# Patient Record
Sex: Female | Born: 1935 | Race: Black or African American | Hispanic: No | Smoking: Never smoker
Health system: Southern US, Community
[De-identification: ages and names within clinical notes are randomized; demographics above are authoritative.]

## PROBLEM LIST (undated history)

## (undated) DIAGNOSIS — I10 Essential (primary) hypertension: Secondary | ICD-10-CM

## (undated) DIAGNOSIS — I2510 Atherosclerotic heart disease of native coronary artery without angina pectoris: Secondary | ICD-10-CM

## (undated) DIAGNOSIS — M1990 Unspecified osteoarthritis, unspecified site: Secondary | ICD-10-CM

## (undated) DIAGNOSIS — R29898 Other symptoms and signs involving the musculoskeletal system: Secondary | ICD-10-CM

## (undated) DIAGNOSIS — N289 Disorder of kidney and ureter, unspecified: Secondary | ICD-10-CM

## (undated) DIAGNOSIS — R001 Bradycardia, unspecified: Secondary | ICD-10-CM

## (undated) DIAGNOSIS — Z950 Presence of cardiac pacemaker: Secondary | ICD-10-CM

## (undated) HISTORY — PX: PACEMAKER INSERTION: SHX728

## (undated) HISTORY — PX: SHOULDER SURGERY: SHX246

## (undated) HISTORY — PX: ABDOMINAL HYSTERECTOMY: SHX81

## (undated) HISTORY — PX: OTHER SURGICAL HISTORY: SHX169

## (undated) HISTORY — PX: REPLACEMENT TOTAL KNEE BILATERAL: SUR1225

## (undated) HISTORY — PX: JOINT REPLACEMENT: SHX530

---

## 2008-09-08 ENCOUNTER — Emergency Department (HOSPITAL_COMMUNITY): Admission: EM | Admit: 2008-09-08 | Discharge: 2008-09-08 | Payer: Self-pay | Admitting: Emergency Medicine

## 2010-08-10 ENCOUNTER — Emergency Department (HOSPITAL_BASED_OUTPATIENT_CLINIC_OR_DEPARTMENT_OTHER): Admission: EM | Admit: 2010-08-10 | Discharge: 2010-08-10 | Payer: Self-pay | Admitting: Emergency Medicine

## 2010-08-10 ENCOUNTER — Ambulatory Visit: Payer: Self-pay | Admitting: Diagnostic Radiology

## 2010-09-03 ENCOUNTER — Emergency Department (HOSPITAL_BASED_OUTPATIENT_CLINIC_OR_DEPARTMENT_OTHER)
Admission: EM | Admit: 2010-09-03 | Discharge: 2010-09-03 | Disposition: A | Discharge status: Other Acute Inpatient Hospital | Payer: Self-pay | Source: Home / Self Care | Admitting: Emergency Medicine

## 2010-11-30 LAB — POCT CARDIAC MARKERS
Myoglobin, poc: 109 ng/mL (ref 12–200)
Troponin i, poc: <0.05 ng/mL (ref 0.00–0.09)

## 2010-11-30 LAB — COMPREHENSIVE METABOLIC PANEL
Albumin: 3.7 g/dL (ref 3.5–5.2)
BUN: 65 mg/dL — ABNORMAL HIGH (ref 6–23)
Calcium: 9.7 mg/dL (ref 8.4–10.5)
Creatinine, Ser: 2.4 mg/dL — ABNORMAL HIGH (ref 0.4–1.2)
Potassium: 5.4 mEq/L — ABNORMAL HIGH (ref 3.5–5.1)
Total Protein: 6.5 g/dL (ref 6.0–8.3)

## 2010-11-30 LAB — CBC
MCH: 32.3 pg (ref 26.0–34.0)
MCHC: 33.4 g/dL (ref 30.0–36.0)
MCV: 97 fL (ref 78.0–100.0)
Platelets: 152 10*3/uL (ref 150–400)
RDW: 12.8 % (ref 11.5–15.5)
WBC: 4.4 10*3/uL (ref 4.0–10.5)

## 2010-11-30 LAB — APTT: aPTT: 31 seconds (ref 24–37)

## 2010-11-30 LAB — PROTIME-INR: INR: 1.09 (ref 0.00–1.49)

## 2010-11-30 LAB — POCT B-TYPE NATRIURETIC PEPTIDE (BNP): B Natriuretic Peptide, POC: 197 pg/mL — ABNORMAL HIGH (ref 0–100)

## 2010-12-01 LAB — DIFFERENTIAL
Basophils Relative: 0 % (ref 0–1)
Lymphocytes Relative: 13 % (ref 12–46)
Lymphs Abs: 0.7 10*3/uL (ref 0.7–4.0)
Monocytes Absolute: 0.3 10*3/uL (ref 0.1–1.0)
Monocytes Relative: 7 % (ref 3–12)
Neutro Abs: 4.3 10*3/uL (ref 1.7–7.7)
Neutrophils Relative %: 78 % — ABNORMAL HIGH (ref 43–77)

## 2010-12-01 LAB — BASIC METABOLIC PANEL
BUN: 40 mg/dL — ABNORMAL HIGH (ref 6–23)
CO2: 23 mEq/L (ref 19–32)
Calcium: 9.4 mg/dL (ref 8.4–10.5)
Creatinine, Ser: 1.8 mg/dL — ABNORMAL HIGH (ref 0.4–1.2)
GFR calc Af Amer: 33 mL/min — ABNORMAL LOW (ref >60)

## 2010-12-01 LAB — CBC
HCT: 30.5 % — ABNORMAL LOW (ref 36.0–46.0)
Hemoglobin: 10.8 g/dL — ABNORMAL LOW (ref 12.0–15.0)
MCHC: 35.5 g/dL (ref 30.0–36.0)
RBC: 3.18 MIL/uL — ABNORMAL LOW (ref 3.87–5.11)

## 2010-12-01 LAB — URINE MICROSCOPIC-ADD ON

## 2010-12-01 LAB — URINE CULTURE: Culture: NO GROWTH

## 2010-12-01 LAB — URINALYSIS, ROUTINE W REFLEX MICROSCOPIC
Glucose, UA: NEGATIVE mg/dL
Nitrite: NEGATIVE
pH: 5.5 (ref 5.0–8.0)

## 2011-02-26 ENCOUNTER — Emergency Department (INDEPENDENT_AMBULATORY_CARE_PROVIDER_SITE_OTHER): Payer: PRIVATE HEALTH INSURANCE

## 2011-02-26 ENCOUNTER — Emergency Department (HOSPITAL_BASED_OUTPATIENT_CLINIC_OR_DEPARTMENT_OTHER)
Admission: EM | Admit: 2011-02-26 | Discharge: 2011-02-26 | Disposition: A | Discharge status: Home / Self Care | Payer: PRIVATE HEALTH INSURANCE | Attending: Emergency Medicine | Admitting: Emergency Medicine

## 2011-02-26 DIAGNOSIS — Z9884 Bariatric surgery status: Secondary | ICD-10-CM | POA: Insufficient documentation

## 2011-02-26 DIAGNOSIS — M109 Gout, unspecified: Secondary | ICD-10-CM | POA: Insufficient documentation

## 2011-02-26 DIAGNOSIS — M129 Arthropathy, unspecified: Secondary | ICD-10-CM | POA: Insufficient documentation

## 2011-02-26 DIAGNOSIS — Z950 Presence of cardiac pacemaker: Secondary | ICD-10-CM | POA: Insufficient documentation

## 2011-02-26 DIAGNOSIS — R109 Unspecified abdominal pain: Secondary | ICD-10-CM | POA: Insufficient documentation

## 2011-02-26 DIAGNOSIS — I10 Essential (primary) hypertension: Secondary | ICD-10-CM | POA: Insufficient documentation

## 2011-02-26 DIAGNOSIS — Z79899 Other long term (current) drug therapy: Secondary | ICD-10-CM | POA: Insufficient documentation

## 2011-02-26 DIAGNOSIS — N289 Disorder of kidney and ureter, unspecified: Secondary | ICD-10-CM | POA: Insufficient documentation

## 2011-02-26 DIAGNOSIS — Z96659 Presence of unspecified artificial knee joint: Secondary | ICD-10-CM | POA: Insufficient documentation

## 2011-02-26 LAB — DIFFERENTIAL
Basophils Absolute: 0 10*3/uL (ref 0.0–0.1)
Basophils Relative: 1 % (ref 0–1)
Lymphocytes Relative: 9 % — ABNORMAL LOW (ref 12–46)
Monocytes Absolute: 0.3 10*3/uL (ref 0.1–1.0)
Neutro Abs: 5.1 10*3/uL (ref 1.7–7.7)
Neutrophils Relative %: 85 % — ABNORMAL HIGH (ref 43–77)

## 2011-02-26 LAB — CBC
HCT: 32.3 % — ABNORMAL LOW (ref 36.0–46.0)
Hemoglobin: 10.7 g/dL — ABNORMAL LOW (ref 12.0–15.0)
MCHC: 33.1 g/dL (ref 30.0–36.0)
WBC: 6 10*3/uL (ref 4.0–10.5)

## 2011-02-26 LAB — URINALYSIS, ROUTINE W REFLEX MICROSCOPIC
Bilirubin Urine: NEGATIVE
Glucose, UA: NEGATIVE mg/dL
Hgb urine dipstick: NEGATIVE
Specific Gravity, Urine: 1.015 (ref 1.005–1.030)
Urobilinogen, UA: 0.2 mg/dL (ref 0.0–1.0)
pH: 5.5 (ref 5.0–8.0)

## 2011-02-26 LAB — URINE MICROSCOPIC-ADD ON

## 2011-02-26 LAB — COMPREHENSIVE METABOLIC PANEL
ALT: 13 U/L (ref 0–35)
Albumin: 3.6 g/dL (ref 3.5–5.2)
Alkaline Phosphatase: 58 U/L (ref 39–117)
Chloride: 98 mEq/L (ref 96–112)
GFR calc Af Amer: 26 mL/min — ABNORMAL LOW (ref >60)
Glucose, Bld: 102 mg/dL — ABNORMAL HIGH (ref 70–99)
Potassium: 4.2 mEq/L (ref 3.5–5.1)
Sodium: 136 mEq/L (ref 135–145)
Total Bilirubin: 0.4 mg/dL (ref 0.3–1.2)
Total Protein: 6.8 g/dL (ref 6.0–8.3)

## 2011-06-02 ENCOUNTER — Other Ambulatory Visit: Payer: Self-pay

## 2011-06-02 ENCOUNTER — Emergency Department (HOSPITAL_BASED_OUTPATIENT_CLINIC_OR_DEPARTMENT_OTHER)
Admission: EM | Admit: 2011-06-02 | Discharge: 2011-06-02 | Disposition: A | Discharge status: Home / Self Care | Payer: PRIVATE HEALTH INSURANCE | Source: Home / Self Care | Attending: Emergency Medicine | Admitting: Emergency Medicine

## 2011-06-02 ENCOUNTER — Inpatient Hospital Stay (HOSPITAL_COMMUNITY)
Admission: EM | Admit: 2011-06-02 | Discharge: 2011-06-04 | DRG: 694 | Disposition: A | Discharge status: Home / Self Care | Payer: PRIVATE HEALTH INSURANCE | Source: Ambulatory Visit | Attending: Urology | Admitting: Urology

## 2011-06-02 ENCOUNTER — Emergency Department (INDEPENDENT_AMBULATORY_CARE_PROVIDER_SITE_OTHER): Payer: PRIVATE HEALTH INSURANCE

## 2011-06-02 ENCOUNTER — Encounter: Payer: Self-pay | Admitting: Emergency Medicine

## 2011-06-02 DIAGNOSIS — I517 Cardiomegaly: Secondary | ICD-10-CM

## 2011-06-02 DIAGNOSIS — N1330 Unspecified hydronephrosis: Secondary | ICD-10-CM | POA: Diagnosis present

## 2011-06-02 DIAGNOSIS — R109 Unspecified abdominal pain: Secondary | ICD-10-CM

## 2011-06-02 DIAGNOSIS — N201 Calculus of ureter: Principal | ICD-10-CM | POA: Diagnosis present

## 2011-06-02 DIAGNOSIS — N189 Chronic kidney disease, unspecified: Secondary | ICD-10-CM | POA: Diagnosis present

## 2011-06-02 DIAGNOSIS — E7800 Pure hypercholesterolemia, unspecified: Secondary | ICD-10-CM | POA: Diagnosis present

## 2011-06-02 DIAGNOSIS — N390 Urinary tract infection, site not specified: Secondary | ICD-10-CM | POA: Diagnosis present

## 2011-06-02 DIAGNOSIS — N12 Tubulo-interstitial nephritis, not specified as acute or chronic: Secondary | ICD-10-CM

## 2011-06-02 DIAGNOSIS — M069 Rheumatoid arthritis, unspecified: Secondary | ICD-10-CM | POA: Diagnosis present

## 2011-06-02 DIAGNOSIS — K449 Diaphragmatic hernia without obstruction or gangrene: Secondary | ICD-10-CM

## 2011-06-02 DIAGNOSIS — Z950 Presence of cardiac pacemaker: Secondary | ICD-10-CM

## 2011-06-02 DIAGNOSIS — Q628 Other congenital malformations of ureter: Secondary | ICD-10-CM

## 2011-06-02 DIAGNOSIS — I129 Hypertensive chronic kidney disease with stage 1 through stage 4 chronic kidney disease, or unspecified chronic kidney disease: Secondary | ICD-10-CM | POA: Diagnosis present

## 2011-06-02 DIAGNOSIS — I700 Atherosclerosis of aorta: Secondary | ICD-10-CM

## 2011-06-02 DIAGNOSIS — N200 Calculus of kidney: Secondary | ICD-10-CM

## 2011-06-02 HISTORY — DX: Essential (primary) hypertension: I10

## 2011-06-02 HISTORY — DX: Bradycardia, unspecified: R00.1

## 2011-06-02 HISTORY — DX: Disorder of kidney and ureter, unspecified: N28.9

## 2011-06-02 HISTORY — DX: Unspecified osteoarthritis, unspecified site: M19.90

## 2011-06-02 HISTORY — DX: Atherosclerotic heart disease of native coronary artery without angina pectoris: I25.10

## 2011-06-02 LAB — CBC
Hemoglobin: 10.6 g/dL — ABNORMAL LOW (ref 12.0–15.0)
MCHC: 32.7 g/dL (ref 30.0–36.0)
Platelets: 149 10*3/uL — ABNORMAL LOW (ref 150–400)
RDW: 14.8 % (ref 11.5–15.5)

## 2011-06-02 LAB — URINALYSIS, ROUTINE W REFLEX MICROSCOPIC
Bilirubin Urine: NEGATIVE
Ketones, ur: NEGATIVE mg/dL
Specific Gravity, Urine: 1.012 (ref 1.005–1.030)
pH: 6.5 (ref 5.0–8.0)

## 2011-06-02 LAB — DIFFERENTIAL
Basophils Absolute: 0 10*3/uL (ref 0.0–0.1)
Basophils Relative: 1 % (ref 0–1)
Neutro Abs: 4.6 10*3/uL (ref 1.7–7.7)
Neutrophils Relative %: 78 % — ABNORMAL HIGH (ref 43–77)

## 2011-06-02 LAB — URINE MICROSCOPIC-ADD ON

## 2011-06-02 LAB — BASIC METABOLIC PANEL
Chloride: 105 mEq/L (ref 96–112)
GFR calc Af Amer: 36 mL/min — ABNORMAL LOW (ref >60)
GFR calc non Af Amer: 29 mL/min — ABNORMAL LOW (ref >60)
Potassium: 4.5 mEq/L (ref 3.5–5.1)

## 2011-06-02 LAB — LACTIC ACID, PLASMA: Lactic Acid, Venous: 1.6 mmol/L (ref 0.5–2.2)

## 2011-06-02 LAB — SURGICAL PCR SCREEN: Staphylococcus aureus: POSITIVE — AB

## 2011-06-02 MED ORDER — DEXTROSE 5 % IV SOLN
INTRAVENOUS | Status: AC
  Administered 2011-06-02: 50 mL
  Filled 2011-06-02: qty 50

## 2011-06-02 MED ORDER — SODIUM CHLORIDE 0.9 % IV BOLUS (SEPSIS)
1000.0000 mL | Freq: Once | INTRAVENOUS | Status: AC
  Administered 2011-06-02: 1000 mL via INTRAVENOUS

## 2011-06-02 MED ORDER — MORPHINE SULFATE 4 MG/ML IJ SOLN
4.0000 mg | Freq: Once | INTRAMUSCULAR | Status: AC
  Administered 2011-06-02: 4 mg via INTRAVENOUS
  Filled 2011-06-02: qty 1

## 2011-06-02 MED ORDER — HYDROMORPHONE HCL 1 MG/ML IJ SOLN
1.0000 mg | Freq: Once | INTRAMUSCULAR | Status: AC
  Administered 2011-06-02: 1 mg via INTRAVENOUS
  Filled 2011-06-02: qty 1

## 2011-06-02 MED ORDER — CEPHALEXIN 500 MG PO CAPS: 500.0000 mg | ORAL_CAPSULE | Freq: Four times a day (QID) | ORAL | Status: AC

## 2011-06-02 MED ORDER — ONDANSETRON HCL 4 MG/2ML IJ SOLN
4.0000 mg | Freq: Once | INTRAMUSCULAR | Status: AC
  Administered 2011-06-02: 4 mg via INTRAVENOUS
  Filled 2011-06-02: qty 2

## 2011-06-02 MED ORDER — DIPHENHYDRAMINE HCL 50 MG/ML IJ SOLN
25.0000 mg | Freq: Once | INTRAMUSCULAR | Status: AC
  Administered 2011-06-02: 25 mg via INTRAVENOUS
  Filled 2011-06-02: qty 1

## 2011-06-02 MED ORDER — SODIUM CHLORIDE 0.9 % IV SOLN: Freq: Once | INTRAVENOUS | Status: DC

## 2011-06-02 MED ORDER — PROMETHAZINE HCL 25 MG/ML IJ SOLN
12.5000 mg | Freq: Four times a day (QID) | INTRAMUSCULAR | Status: DC | PRN
  Administered 2011-06-02: 12.5 mg via INTRAVENOUS
  Filled 2011-06-02: qty 1

## 2011-06-02 MED ORDER — DEXTROSE 5 % IV SOLN
1.0000 g | Freq: Once | INTRAVENOUS | Status: AC
  Administered 2011-06-02: 1 g via INTRAVENOUS
  Filled 2011-06-02: qty 1

## 2011-06-02 NOTE — ED Provider Notes (Addendum)
History     CSN: [id number] Arrival date & time: 06/02/2011  9:44 AM  Chief Complaint  Patient presents with  . Abdominal Pain  . Flank Pain   HPI Comments: Patient presents with left lower quadrant abdominal pain started last night. The pain became more severe this morning it radiates to her left flank left lower back. She has dysuria but no hematuria. She endorses history of kidney insufficiency and kidney infections. She denies any vaginal bleeding or discharge. She has no nausea, vomiting or diarrhea constipation. Good by mouth intake and normal bowel habits. Denies any chest pain or shortness of breath. No fever. Thinks she may have had a kidney stone the past.  Patient is a 75 y.o. female presenting with abdominal pain and flank pain. The history is provided by the patient.  Abdominal Pain The primary symptoms of the illness include abdominal pain and dysuria. The primary symptoms of the illness do not include fever or shortness of breath.  The dysuria is associated with frequency and urgency.   Additional symptoms associated with the illness include chills, urgency, frequency and back pain.  Flank Pain Associated symptoms include abdominal pain. Pertinent negatives include no chest pain, no headaches and no shortness of breath.    Past Medical History  Diagnosis Date  . Arthritis   . Renal disorder     kidney stones  . Renal insufficiency   . Bradycardia   . Hypertension   . Gout   . Coronary artery disease     Past Surgical History  Procedure Date  . Abdominal hysterectomy   . Joint replacement     knee replacement  . Arm surgery   . Tummy tuck   . Shoulder surgery   . Pacemaker insertion     History reviewed. No pertinent family history.  History  Substance Use Topics  . Smoking status: Never Smoker   . Smokeless tobacco: Not on file  . Alcohol Use: Yes     occasional    OB History    Grav Para Term Preterm Abortions TAB SAB Ect Mult Living        Review of Systems  Constitutional: Positive for chills. Negative for fever, activity change and appetite change.  HENT: Negative.   Respiratory: Negative for choking and shortness of breath.   Cardiovascular: Negative for chest pain.  Gastrointestinal: Positive for abdominal pain.  Genitourinary: Positive for dysuria, urgency, frequency and flank pain.  Musculoskeletal: Positive for back pain.  Neurological: Negative for headaches.  Hematological: Negative.   Psychiatric/Behavioral: Negative.     Physical Exam  BP 136/68  Pulse 70  Temp(Src) 98.4 F (36.9 C) (Oral)  Resp 22  Ht 5\' 5"  (1.651 m)  Wt 236 lb (107.049 kg)  BMI 39.27 kg/m2  SpO2 100%  Physical Exam  Constitutional: She is oriented to person, place, and time. She appears well-developed and well-nourished. She appears distressed.       Uncomfortable  HENT:  Head: Normocephalic and atraumatic.  Mouth/Throat: Oropharynx is clear and moist. No oropharyngeal exudate.  Eyes: Conjunctivae are normal. Pupils are equal, round, and reactive to light.  Neck: Normal range of motion.  Cardiovascular: Normal rate, regular rhythm and normal heart sounds.   Pulmonary/Chest: Effort normal and breath sounds normal. No respiratory distress.  Abdominal: Soft. There is tenderness.       Left lower quadrant area palpation, left flank tender  Musculoskeletal: She exhibits tenderness.       Left CVA tenderness  Neurological:  She is alert and oriented to person, place, and time. No cranial nerve deficit.  Skin: Skin is warm.    ED Course  Procedures  MDM Flank and abdominal pain with dysuria, similar to previous kidney infections. We'll evaluate with urinalysis, chemistry, CBC provide IV hydration and symptom control. 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  New stranding which with UA, is consistent with pyelonephritis.  Tiny  punctate density, possible UVJ stone.  Cr improved from previous.  CT abnormalities discussed with on-call urologist Dr. Alinda Money.  He agrees hydronephrosis on the left may be chronic but he cannot rule out a tiny stone. He like to see and evaluate the patient in his office today adjacent to [facility].  Patient's hematodynamically stable and her pain is well-controlled. Her son is here to drive her Dr. Lynne Logan office The patient and her son understand that he should be seen immediately after leaving the ED.    Date: 06/02/2011  Rate: 74  Rhythm: normal sinus rhythm  QRS Axis: normal  Intervals: normal  ST/T Wave abnormalities: normal  Conduction Disutrbances:none  Narrative Interpretation:   Old EKG Reviewed: unchanged    Results for orders placed during the hospital encounter of 06/02/11  URINALYSIS, ROUTINE W REFLEX MICROSCOPIC      Component Value Range   Color, Urine YELLOW  YELLOW    Appearance CLOUDY (*) CLEAR    Specific Gravity, Urine 1.012  1.005 - 1.030    pH 6.5  5.0 - 8.0    Glucose, UA NEGATIVE  NEGATIVE (mg/dL)   Hgb urine dipstick MODERATE (*) NEGATIVE    Bilirubin Urine NEGATIVE  NEGATIVE    Ketones, ur NEGATIVE  NEGATIVE (mg/dL)   Protein, ur 100 (*) NEGATIVE (mg/dL)   Urobilinogen, UA 0.2  0.0 - 1.0 (mg/dL)   Nitrite NEGATIVE  NEGATIVE    Leukocytes, UA LARGE (*) NEGATIVE   CBC      Component Value Range   WBC 5.9  4.0 - 10.5 (K/uL)   RBC 3.13 (*) 3.87 - 5.11 (MIL/uL)   Hemoglobin 10.6 (*) 12.0 - 15.0 (g/dL)   HCT 32.4 (*) 36.0 - 46.0 (%)   MCV 103.5 (*) 78.0 - 100.0 (fL)   MCH 33.9  26.0 - 34.0 (pg)   MCHC 32.7  30.0 - 36.0 (g/dL)   RDW 14.8  11.5 - 15.5 (%)   Platelets 149 (*) 150 - 400 (K/uL)  DIFFERENTIAL      Component Value Range   Neutrophils Relative 78 (*) 43 - 77 (%)   Neutro Abs 4.6  1.7 - 7.7 (K/uL)   Lymphocytes Relative 13  12 - 46 (%)   Lymphs Abs 0.7  0.7 - 4.0 (K/uL)   Monocytes Relative 7  3 - 12 (%)   Monocytes Absolute  0.4  0.1 - 1.0 (K/uL)   Eosinophils Relative 1  0 - 5 (%)   Eosinophils Absolute 0.1  0.0 - 0.7 (K/uL)   Basophils Relative 1  0 - 1 (%)   Basophils Absolute 0.0  0.0 - 0.1 (K/uL)  BASIC METABOLIC PANEL      Component Value Range   Sodium 142  135 - 145 (mEq/L)   Potassium 4.5  3.5 - 5.1 (mEq/L)   Chloride 105  96 - 112 (mEq/L)   CO2 27  19 - 32 (mEq/L)   Glucose, Bld 95  70 - 99 (mg/dL)  BUN 39 (*) 6 - 23 (mg/dL)   Creatinine, Ser 1.70 (*) 0.50 - 1.10 (mg/dL)   Calcium 10.4  8.4 - 10.5 (mg/dL)   GFR calc non Af Amer 29 (*) >60 (mL/min)   GFR calc Af Amer 36 (*) >60 (mL/min)  LACTIC ACID, PLASMA      Component Value Range   Lactic Acid, Venous 1.6  0.5 - 2.2 (mmol/L)  URINE MICROSCOPIC-ADD ON      Component Value Range   Squamous Epithelial / LPF FEW (*) RARE    WBC, UA TOO NUMEROUS TO COUNT  <3 (WBC/hpf)   RBC / HPF TOO NUMEROUS TO COUNT  <3 (RBC/hpf)   Bacteria, UA MANY (*) RARE    Ct Abdomen Pelvis Wo Contrast  06/02/2011  *RADIOLOGY REPORT*  Clinical Data: Abdominal/bilateral flank pain  CT ABDOMEN AND PELVIS WITHOUT CONTRAST  Technique:  Multidetector CT imaging of the abdomen and pelvis was performed following the standard protocol without intravenous contrast.  Comparison: 02/26/2011  Findings: Lung bases are essentially clear.  Cardiomegaly. Pacemaker leads, incompletely visualized.  Small hiatal hernia.  Unenhanced liver, spleen, pancreas, and adrenal glands are within normal limits.  Status post cholecystectomy.  Stable common bile duct.  No intrahepatic ductal dilatation.  Right kidney is unremarkable.  Left kidney is notable for a duplicated collecting system with extrarenal pelvis/UPJ obstruction and interval development of mild surrounding perinephric stranding.  No evidence of bowel obstruction.  Atherosclerotic calcifications of the abdominal aorta and branch vessels.  No abdominopelvic ascites.  No suspicious abdominopelvic lymphadenopathy.  Status post hysterectomy.  No  adnexal masses.  Suspected punctate distal left ureteral calculus at the UVJ (series 2/image 70).  Bladder is unremarkable.  Degenerative changes of the visualized thoracolumbar spine.  Grade 1 anterolisthesis of L4 on L5.  IMPRESSION: Suspected punctate distal left ureteral calculus at the UVJ.  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  Interval development of surrounding perinephric stranding.  Original Report Authenticated By: Julian Hy, M.D.      Ezequiel Essex, MD 06/02/11 Dorris, MD 06/02/11 [phone]

## 2011-06-02 NOTE — ED Notes (Signed)
Pt having LLQ abdominal pain radiating to left flank, starting yesterday.  Burning and painful urination.  No known fever, some chills.  No N/V/D.  Back pain worsens with urination.

## 2011-06-03 LAB — BASIC METABOLIC PANEL
BUN: 40 mg/dL — ABNORMAL HIGH (ref 6–23)
CO2: 22 mEq/L (ref 19–32)
Calcium: 9.2 mg/dL (ref 8.4–10.5)
Calcium: 9.2 mg/dL (ref 8.4–10.5)
Chloride: 100 mEq/L (ref 96–112)
Creatinine, Ser: 2.51 mg/dL — ABNORMAL HIGH (ref 0.50–1.10)
Creatinine, Ser: 2.72 mg/dL — ABNORMAL HIGH (ref 0.50–1.10)
GFR calc Af Amer: 23 mL/min — ABNORMAL LOW (ref >60)
GFR calc non Af Amer: 17 mL/min — ABNORMAL LOW (ref >60)
Glucose, Bld: 120 mg/dL — ABNORMAL HIGH (ref 70–99)
Sodium: 134 mEq/L — ABNORMAL LOW (ref 135–145)

## 2011-06-03 LAB — CBC
HCT: 28.5 % — ABNORMAL LOW (ref 36.0–46.0)
MCH: 33.5 pg (ref 26.0–34.0)
MCV: 103.6 fL — ABNORMAL HIGH (ref 78.0–100.0)
Platelets: 143 10*3/uL — ABNORMAL LOW (ref 150–400)
RDW: 15.6 % — ABNORMAL HIGH (ref 11.5–15.5)
WBC: 7.4 10*3/uL (ref 4.0–10.5)

## 2011-06-03 LAB — DIFFERENTIAL
Basophils Relative: 0 % (ref 0–1)
Eosinophils Absolute: 0 10*3/uL (ref 0.0–0.7)
Eosinophils Relative: 0 % (ref 0–5)
Monocytes Relative: 8 % (ref 3–12)
Neutrophils Relative %: 84 % — ABNORMAL HIGH (ref 43–77)

## 2011-06-04 LAB — URINE CULTURE: Culture  Setup Time: 201209130114

## 2011-06-04 LAB — BASIC METABOLIC PANEL
BUN: 37 mg/dL — ABNORMAL HIGH (ref 6–23)
Calcium: 9.3 mg/dL (ref 8.4–10.5)
GFR calc Af Amer: 23 mL/min — ABNORMAL LOW (ref >60)
GFR calc non Af Amer: 19 mL/min — ABNORMAL LOW (ref >60)
Glucose, Bld: 105 mg/dL — ABNORMAL HIGH (ref 70–99)

## 2011-06-10 NOTE — Op Note (Signed)
[name], [name] NO.:  [id number]  MEDICAL RECORD NO.:  [id number]  LOCATION:  [id number]                         FACILITY:  [facility]  PHYSICIAN:  Raynelle Bring, MD      DATE OF BIRTH:  07-31-1936  DATE OF PROCEDURE:  06/02/2011 DATE OF DISCHARGE:                              OPERATIVE REPORT   PREOPERATIVE DIAGNOSES: 1. Distal left ureteral stone. 2. Urinary tract infection.  POSTOPERATIVE DIAGNOSES: 1. Distal left ureteral stone. 2. Urinary tract infection. 3. Duplicated left ureters.  PROCEDURES: 1. Cystoscopy. 2. Left retrograde pyelography with interpretation. 3. Left ureteroscopy. 4. Left ureteral stent placement x2 (6 x 24).  INTRAOPERATIVE FINDINGS:  Left retrograde pyelography demonstrated duplicated left ureters down to the very distal aspect where they converged approximately 3 to 4 cm superior to the ureteral orifice.  SURGEON:  Raynelle Bring, MD  ANESTHESIA:  General.  COMPLICATIONS:  None.  INDICATION:  [name] is a 75 year old female who presented to the [facility] earlier today with complaint of left-sided flank pain.  She was found to have left-sided hydronephrosis with a possible 1 mm distal left ureteral stone.  She also was noted to have urinary tract infection.  She was afebrile.  However, considering her findings, it was recommended that she present to our office for further evaluation, and after discussion, it was decided to proceed with left ureteral stent placement considering her obstruction and infection.  The potential risks, complications, and alternative treatment options associated with the above procedures were discussed in detail and informed consent obtained.  DESCRIPTION OF PROCEDURE:  The patient was taken to the operating room and a general anesthetic was administered.  She was given preoperative antibiotics, placed in the dorsal lithotomy position, and prepped and draped in the usual sterile  fashion.  Preoperative time-out was performed.  Cystourethroscopy was then performed which revealed a slightly edematous distal left ureter without evidence of any bladder tumors, stones, or other mucosal pathology.  The urine from the bladder was noted to be somewhat cloudy.  Urine culture had already been obtained.  Attention then turned to the left ureteral orifice and a 6- [place] ureteral catheter was used to intubate the distal left ureteral orifice and retrograde pyelogram was performed with Omnipaque contrast considering the patient's known history of a duplicated system.  This outlined the anatomy and showed a duplicated system down to the most distal aspect of the ureter.  In order to ensure appropriate drainage of the left kidney, it was decided to proceed with 2 ureteral stents. Attempts to place a guidewire, however, were not successful.  Therefore, the semi-rigid ureteroscope was used to examine the distal ureter and the 2 separate duplicated ureters were identified where they converged. Under direct vision, two separate 0.038 sensor guide wires were advanced up each ureter into the upper pole and lower pole moieties respectively. The ureteroscope was then removed and the wire to the upper pole moiety was back loaded on the cystoscope and a 6 x 24 double-J ureteral stent was advanced over this wire using Seldinger technique and positioned appropriately under fluoroscopic and cystoscopic guidance with a wire subsequently removed.  The lower pole moiety wire was then back loaded on the stent and another 6 x 24 double-J stent was placed in a similar fashion.  The patient tolerated the procedure well and without complications.  Her bladder was emptied.  She was able to be awakened and transferred to recovery unit in satisfactory condition.  She will be maintained on IV antibiotics postoperatively.  Of note, no distal ureteral stone was identified thereby indicating that either no  ureteral stone was present or the stone had migrated proximally.  Either way, it was felt that she was adequately drained at this point and therefore would clinically improve.     Raynelle Bring, MD     LB/MEDQ  D:  06/02/2011  T:  06/03/2011  Job:  [id number]  Electronically Signed by Raynelle Bring MD on 06/10/2011 08:13:54 PM

## 2011-06-10 NOTE — Discharge Summary (Signed)
  NAMEJIMMI, MUHLBAUER NO.:  [id number]  MEDICAL RECORD NO.:  [id number]  LOCATION:  [id number]                         FACILITY:  [facility]  PHYSICIAN:  Raynelle Bring, MD      DATE OF BIRTH:  08/17/1936  DATE OF ADMISSION:  06/02/2011 DATE OF DISCHARGE:  06/04/2011                              DISCHARGE SUMMARY   ADMISSION DIAGNOSES: 1. Urinary tract infection. 2. Left ureteral obstruction secondary to ureteral stone.  SECONDARY DIAGNOSES: 1. Hypertension. 2. Rheumatoid arthritis. 3. Chronic kidney disease.  POSTOPERATIVE DIAGNOSES: 1. Hypertension. 2. Rheumatoid arthritis. 3. Acute kidney injury.  HISTORY AND PHYSICAL:  For full details, please see admission history and physical.  Briefly, [name] is a 75 year old female who presented on June 02, 2011 to our office with complaints of an urinary tract infection along with left lower quadrant and left flank pain.  She did undergo a CT scan earlier that day at the [facility] emergency room, which indicated a probable 1 mm distal obstructing stone.  It was recommended that she proceed to the operating room for ureteral stent placement considering her infection although she was afebrile at that time.  She was found to have a duplicated left ureter during her operative procedure, which included cystoscopy, ureteroscopy, and 2 ureteral stents placed in her duplicated left ureters.  Postoperatively, she significantly improved with regard to her pain.  She remained afebrile.  On postoperative day 1, she was overall felt to be stable for discharge home, but her serum creatinine was noted to be elevated at 2.51.  There initially was no explanation and the patient being a poor historian had not initially notified us that she had a history of chronic kidney disease.  On further investigation, it was found that she in fact did have a history of chronic kidney disease and has been followed by a nephrologist.  Her  baseline serum creatinine was around 2.0.  On the afternoon of postoperative day 1, her serum creatinine had increased to 2.72 and for this reason, it was felt that we should continue to monitor her to ensure that her creatinine would not continue to increase.  By the following morning, her serum creatinine had improved to 2.46 and she was otherwise felt to be stable for discharge home.  She continued to have good pain control with minimal symptoms related to her ureteral stent.  DISPOSITION:  Home.  DISCHARGE MEDICATIONS:  She will resume all of her regular home medications.  In addition, she will continue Levaquin 250 mg daily.  Her urine culture is pending at the time of this dictation.  She also has not provided a prescription to take Vicodin as needed for pain.  DISCHARGE INSTRUCTIONS:  She will resume activity as tolerated and her diet as before hospital admission.  FOLLOWUP:  She has been scheduled to follow up in 2 weeks for cystoscopy and removal of her ureteral stents.     Raynelle Bring, MD     LB/MEDQ  D:  06/04/2011  T:  06/04/2011  Job:  [id number]  Electronically Signed by Raynelle Bring MD on 06/10/2011 08:13:58 PM

## 2011-06-25 LAB — POCT I-STAT, CHEM 8
Calcium, Ion: 1.29 mmol/L (ref 1.12–1.32)
Glucose, Bld: 95 mg/dL (ref 70–99)
HCT: 30 % — ABNORMAL LOW (ref 36.0–46.0)
Hemoglobin: 10.2 g/dL — ABNORMAL LOW (ref 12.0–15.0)
TCO2: 24 mmol/L (ref 0–100)

## 2012-06-06 ENCOUNTER — Encounter (HOSPITAL_BASED_OUTPATIENT_CLINIC_OR_DEPARTMENT_OTHER): Payer: Self-pay | Admitting: *Deleted

## 2012-06-06 ENCOUNTER — Emergency Department (HOSPITAL_BASED_OUTPATIENT_CLINIC_OR_DEPARTMENT_OTHER): Payer: PRIVATE HEALTH INSURANCE

## 2012-06-06 ENCOUNTER — Emergency Department (HOSPITAL_BASED_OUTPATIENT_CLINIC_OR_DEPARTMENT_OTHER)
Admission: EM | Admit: 2012-06-06 | Discharge: 2012-06-06 | Disposition: A | Discharge status: Home / Self Care | Payer: PRIVATE HEALTH INSURANCE | Attending: Emergency Medicine | Admitting: Emergency Medicine

## 2012-06-06 DIAGNOSIS — R109 Unspecified abdominal pain: Secondary | ICD-10-CM | POA: Insufficient documentation

## 2012-06-06 DIAGNOSIS — I10 Essential (primary) hypertension: Secondary | ICD-10-CM | POA: Insufficient documentation

## 2012-06-06 DIAGNOSIS — R10819 Abdominal tenderness, unspecified site: Secondary | ICD-10-CM | POA: Insufficient documentation

## 2012-06-06 DIAGNOSIS — R350 Frequency of micturition: Secondary | ICD-10-CM | POA: Insufficient documentation

## 2012-06-06 DIAGNOSIS — Z87442 Personal history of urinary calculi: Secondary | ICD-10-CM | POA: Insufficient documentation

## 2012-06-06 DIAGNOSIS — Z79899 Other long term (current) drug therapy: Secondary | ICD-10-CM | POA: Insufficient documentation

## 2012-06-06 LAB — URINALYSIS, ROUTINE W REFLEX MICROSCOPIC
Bilirubin Urine: NEGATIVE
Ketones, ur: NEGATIVE mg/dL
Nitrite: NEGATIVE
Protein, ur: NEGATIVE mg/dL
Urobilinogen, UA: 0.2 mg/dL (ref 0.0–1.0)

## 2012-06-06 LAB — COMPREHENSIVE METABOLIC PANEL
ALT: 10 U/L (ref 0–35)
Alkaline Phosphatase: 52 U/L (ref 39–117)
BUN: 49 mg/dL — ABNORMAL HIGH (ref 6–23)
CO2: 24 mEq/L (ref 19–32)
Calcium: 10.1 mg/dL (ref 8.4–10.5)
GFR calc Af Amer: 31 mL/min — ABNORMAL LOW (ref >90)
GFR calc non Af Amer: 27 mL/min — ABNORMAL LOW (ref >90)
Glucose, Bld: 96 mg/dL (ref 70–99)
Sodium: 138 mEq/L (ref 135–145)

## 2012-06-06 LAB — CBC WITH DIFFERENTIAL/PLATELET
Basophils Relative: 0 % (ref 0–1)
Eosinophils Relative: 2 % (ref 0–5)
HCT: 30.1 % — ABNORMAL LOW (ref 36.0–46.0)
Hemoglobin: 10.1 g/dL — ABNORMAL LOW (ref 12.0–15.0)
Lymphs Abs: 1.2 10*3/uL (ref 0.7–4.0)
MCV: 98.4 fL (ref 78.0–100.0)
Monocytes Relative: 11 % (ref 3–12)
Platelets: 130 10*3/uL — ABNORMAL LOW (ref 150–400)
RBC: 3.06 MIL/uL — ABNORMAL LOW (ref 3.87–5.11)
WBC: 6.9 10*3/uL (ref 4.0–10.5)

## 2012-06-06 LAB — URINE MICROSCOPIC-ADD ON

## 2012-06-06 MED ORDER — OXYCODONE-ACETAMINOPHEN 5-325 MG PO TABS: 1.0000 | ORAL_TABLET | ORAL | Status: DC | PRN

## 2012-06-06 MED ORDER — CEPHALEXIN 500 MG PO CAPS: 500.0000 mg | ORAL_CAPSULE | Freq: Four times a day (QID) | ORAL | Status: DC

## 2012-06-06 MED ORDER — OXYCODONE-ACETAMINOPHEN 5-325 MG PO TABS
1.0000 | ORAL_TABLET | Freq: Once | ORAL | Status: AC
  Administered 2012-06-06: 1 via ORAL
  Filled 2012-06-06 (×2): qty 1

## 2012-06-06 MED ORDER — HYDROCODONE-ACETAMINOPHEN 5-325 MG PO TABS: 2.0000 | ORAL_TABLET | ORAL | Status: DC | PRN

## 2012-06-06 NOTE — ED Provider Notes (Signed)
History     CSN: [id number]  Arrival date & time 06/06/12  1220   First MD Initiated Contact with Patient 06/06/12 1235      Chief Complaint  Patient presents with  . Abdominal Pain    (Consider location/radiation/quality/duration/timing/severity/associated sxs/prior treatment) Patient is a 76 y.o. female presenting with abdominal pain. The history is provided by the patient. No language interpreter was used.  Abdominal Pain The primary symptoms of the illness include abdominal pain. Episode onset: 4 days. The onset of the illness was gradual. The problem has been gradually worsening.  Associated with: pain after urination. Additional symptoms associated with the illness include frequency. Significant associated medical issues do not include PUD, GERD or diverticulitis.   Pt reports she has had kidney stones in the past and has had a stent in the past.  Pt reports pain feels similar,   Pt notices discomfort is in the bladder area Past Medical History  Diagnosis Date  . Arthritis   . Renal disorder     kidney stones  . Renal insufficiency   . Bradycardia   . Hypertension   . Gout   . Coronary artery disease     Past Surgical History  Procedure Date  . Abdominal hysterectomy   . Joint replacement     knee replacement  . Arm surgery   . Tummy tuck   . Shoulder surgery   . Pacemaker insertion     No family history on file.  History  Substance Use Topics  . Smoking status: Never Smoker   . Smokeless tobacco: Not on file  . Alcohol Use: Yes     occasional    OB History    Grav Para Term Preterm Abortions TAB SAB Ect Mult Living                  Review of Systems  Gastrointestinal: Positive for abdominal pain.  Genitourinary: Positive for frequency.  All other systems reviewed and are negative.    Allergies  Toprol xl  Home Medications   Current Outpatient Rx  Name Route Sig Dispense Refill  . AMLODIPINE BESYLATE 5 MG PO TABS Oral Take 5 mg by mouth  2 (two) times daily.     . ASPIRIN 325 MG PO TABS Oral Take 325 mg by mouth daily as needed.      Marland Kitchen BENZONATATE 100 MG PO CAPS Oral Take 100 mg by mouth 3 (three) times daily as needed.     Marland Kitchen VITAMIN D 1000 UNITS PO TABS Oral Take 2,000 Units by mouth daily.     Marland Kitchen U-LACTIN EX Apply externally Apply topically daily as needed. As needed for rash    . FEBUXOSTAT 40 MG PO TABS Oral Take 80 mg by mouth daily.     . OMEGA-3 FATTY ACIDS 1000 MG PO CAPS Oral Take 2 g by mouth daily.     Marland Kitchen FOLIC ACID 1 MG PO TABS Oral Take 1 mg by mouth daily.     Marland Kitchen HYDRALAZINE HCL 25 MG PO TABS Oral Take 25 mg by mouth 3 (three) times daily.     Marland Kitchen HYDROCODONE-ACETAMINOPHEN 5-325 MG PO TABS Oral Take 1 tablet by mouth every 6 (six) hours as needed. As needed for pain    . METHOTREXATE (ANTI-RHEUMATIC) 2.5 MG PO TABS Oral Take 7.5 mg by mouth once a week. Last taken on Monday Sept 10,2012    . NEBIVOLOL HCL 10 MG PO TABS Oral Take 10 mg by mouth  daily.     Marland Kitchen NITROFURANTOIN MACROCRYSTAL 100 MG PO CAPS Oral Take 100 mg by mouth 2 (two) times daily.      . TELMISARTAN 80 MG PO TABS Oral Take 80 mg by mouth daily.      . TORSEMIDE 10 MG PO TABS Oral Take 10 mg by mouth every other day.        BP 127/53  Pulse 66  Temp 98.3 F (36.8 C) (Oral)  Resp 22  SpO2 100%  Physical Exam  Nursing note and vitals reviewed. Constitutional: She is oriented to person, place, and time. She appears well-developed and well-nourished.  HENT:  Head: Normocephalic and atraumatic.  Eyes: Conjunctivae normal are normal. Pupils are equal, round, and reactive to light.  Neck: Normal range of motion.  Pulmonary/Chest: Effort normal.  Abdominal: Soft. There is tenderness.       Suprapubic tenderness  Musculoskeletal: Normal range of motion.  Neurological: She is alert and oriented to person, place, and time.  Skin: Skin is warm.  Psychiatric: She has a normal mood and affect.    ED Course  Procedures (including critical care  time)  Labs Reviewed  URINALYSIS, ROUTINE W REFLEX MICROSCOPIC - Abnormal; Notable for the following:    Leukocytes, UA SMALL (*)     All other components within normal limits  URINE MICROSCOPIC-ADD ON   No results found.   1. Abdominal pain       MDM  Urine shows 3-6 wbc's,  Pt has elevated bun and creatine,  Unchanged since previous,  Ct scan shows chronic changes,  No acute stone.   Pt given pain medication.  I will treat with keflex.   Pt is advised to see her provider  Tomorrow for recheck.  Follow up with urology for evaluation        Fransico Meadow, [place] 06/06/12 1943

## 2012-06-06 NOTE — ED Notes (Signed)
Lower abdominal pain x 4 days. Pain in her lower abdomen after she urinates.

## 2012-06-07 NOTE — ED Provider Notes (Signed)
Medical screening examination/treatment/procedure(s) were performed by non-physician practitioner and as supervising physician I was immediately available for consultation/collaboration.  Veryl Speak, MD 06/07/12 [phone]

## 2012-07-17 ENCOUNTER — Encounter (HOSPITAL_COMMUNITY): Payer: Self-pay | Admitting: Emergency Medicine

## 2012-07-17 ENCOUNTER — Emergency Department (HOSPITAL_COMMUNITY)
Admission: EM | Admit: 2012-07-17 | Discharge: 2012-07-17 | Disposition: A | Discharge status: Home / Self Care | Payer: PRIVATE HEALTH INSURANCE | Attending: Emergency Medicine | Admitting: Emergency Medicine

## 2012-07-17 DIAGNOSIS — I10 Essential (primary) hypertension: Secondary | ICD-10-CM | POA: Insufficient documentation

## 2012-07-17 DIAGNOSIS — Z90710 Acquired absence of both cervix and uterus: Secondary | ICD-10-CM | POA: Insufficient documentation

## 2012-07-17 DIAGNOSIS — Z87442 Personal history of urinary calculi: Secondary | ICD-10-CM | POA: Insufficient documentation

## 2012-07-17 DIAGNOSIS — N289 Disorder of kidney and ureter, unspecified: Secondary | ICD-10-CM | POA: Insufficient documentation

## 2012-07-17 DIAGNOSIS — M129 Arthropathy, unspecified: Secondary | ICD-10-CM | POA: Insufficient documentation

## 2012-07-17 DIAGNOSIS — Z9889 Other specified postprocedural states: Secondary | ICD-10-CM | POA: Insufficient documentation

## 2012-07-17 DIAGNOSIS — Z79899 Other long term (current) drug therapy: Secondary | ICD-10-CM | POA: Insufficient documentation

## 2012-07-17 DIAGNOSIS — I2510 Atherosclerotic heart disease of native coronary artery without angina pectoris: Secondary | ICD-10-CM | POA: Insufficient documentation

## 2012-07-17 DIAGNOSIS — Z950 Presence of cardiac pacemaker: Secondary | ICD-10-CM | POA: Insufficient documentation

## 2012-07-17 DIAGNOSIS — N390 Urinary tract infection, site not specified: Secondary | ICD-10-CM | POA: Insufficient documentation

## 2012-07-17 LAB — COMPREHENSIVE METABOLIC PANEL
AST: 13 U/L (ref 0–37)
Albumin: 3.4 g/dL — ABNORMAL LOW (ref 3.5–5.2)
Alkaline Phosphatase: 58 U/L (ref 39–117)
Chloride: 102 mEq/L (ref 96–112)
Potassium: 4.5 mEq/L (ref 3.5–5.1)
Sodium: 138 mEq/L (ref 135–145)
Total Bilirubin: 0.2 mg/dL — ABNORMAL LOW (ref 0.3–1.2)

## 2012-07-17 LAB — URINALYSIS, MICROSCOPIC ONLY
Bilirubin Urine: NEGATIVE
Glucose, UA: NEGATIVE mg/dL
Ketones, ur: NEGATIVE mg/dL
pH: 6.5 (ref 5.0–8.0)

## 2012-07-17 LAB — CBC WITH DIFFERENTIAL/PLATELET
Basophils Absolute: 0 10*3/uL (ref 0.0–0.1)
Basophils Relative: 0 % (ref 0–1)
MCHC: 33.5 g/dL (ref 30.0–36.0)
Neutro Abs: 5.5 10*3/uL (ref 1.7–7.7)
Neutrophils Relative %: 74 % (ref 43–77)
RDW: 13.2 % (ref 11.5–15.5)

## 2012-07-17 MED ORDER — SODIUM CHLORIDE 0.9 % IV BOLUS (SEPSIS)
500.0000 mL | Freq: Once | INTRAVENOUS | Status: AC
  Administered 2012-07-17: 500 mL via INTRAVENOUS

## 2012-07-17 MED ORDER — SODIUM CHLORIDE 0.9 % IV SOLN
1000.0000 mL | Freq: Once | INTRAVENOUS | Status: AC
  Administered 2012-07-17: 1000 mL via INTRAVENOUS

## 2012-07-17 MED ORDER — HYDROCODONE-ACETAMINOPHEN 5-500 MG PO TABS: 1.0000 | ORAL_TABLET | Freq: Four times a day (QID) | ORAL | Status: DC | PRN

## 2012-07-17 MED ORDER — ONDANSETRON HCL 4 MG/2ML IJ SOLN
4.0000 mg | Freq: Once | INTRAMUSCULAR | Status: AC
  Administered 2012-07-17: 4 mg via INTRAVENOUS
  Filled 2012-07-17: qty 2

## 2012-07-17 MED ORDER — FENTANYL CITRATE 0.05 MG/ML IJ SOLN
50.0000 ug | Freq: Once | INTRAMUSCULAR | Status: AC
  Administered 2012-07-17: 50 ug via INTRAVENOUS
  Filled 2012-07-17: qty 2

## 2012-07-17 MED ORDER — DEXTROSE 5 % IV SOLN
1.0000 g | INTRAVENOUS | Status: DC
  Administered 2012-07-17: 1 g via INTRAVENOUS
  Filled 2012-07-17: qty 10

## 2012-07-17 MED ORDER — CEPHALEXIN 500 MG PO CAPS: 500.0000 mg | ORAL_CAPSULE | Freq: Four times a day (QID) | ORAL | Status: DC

## 2012-07-17 NOTE — ED Provider Notes (Signed)
History     CSN: [id number]  Arrival date & time 07/17/12  1022   First MD Initiated Contact with Patient 07/17/12 1046      Chief Complaint  Patient presents with  . Flank Pain    (Consider location/radiation/quality/duration/timing/severity/associated sxs/prior treatment) Patient is a 76 y.o. female presenting with flank pain. The history is provided by the patient.  Flank Pain Pertinent negatives include no chest pain, no abdominal pain, no headaches and no shortness of breath.  pt c/o urinary urgency, dysuria, and suprapubic pain for past few days. Constant. Dull. Non radiating. No specific exacerbating factors. No back pain. No fever or chills. Pt states had left over abx which she took 2 days ago w mild improvement in symptoms, but then quit taking the antibiotic. ?hx kidney stone. Having normal bms, no diarrhea or constipation. No abd distension. No nv. No fever or chills. Prior abd surgery includes hysterectomy and cholecystectomy.      Past Medical History  Diagnosis Date  . Arthritis   . Renal disorder     kidney stones  . Renal insufficiency   . Bradycardia   . Hypertension   . Gout   . Coronary artery disease     Past Surgical History  Procedure Date  . Abdominal hysterectomy   . Joint replacement     knee replacement  . Arm surgery   . Tummy tuck   . Shoulder surgery   . Pacemaker insertion     No family history on file.  History  Substance Use Topics  . Smoking status: Never Smoker   . Smokeless tobacco: Not on file  . Alcohol Use: Yes     occasional    OB History    Grav Para Term Preterm Abortions TAB SAB Ect Mult Living                  Review of Systems  Constitutional: Negative for fever and chills.  HENT: Negative for neck pain.   Eyes: Negative for redness.  Respiratory: Negative for shortness of breath.   Cardiovascular: Negative for chest pain.  Gastrointestinal: Negative for abdominal pain.  Genitourinary: Positive for  dysuria. Negative for vaginal bleeding and vaginal discharge.  Musculoskeletal: Negative for back pain.  Skin: Negative for rash.  Neurological: Negative for headaches.  Hematological: Does not bruise/bleed easily.  Psychiatric/Behavioral: Negative for confusion.    Allergies  Toprol xl  Home Medications   Current Outpatient Rx  Name Route Sig Dispense Refill  . AMLODIPINE BESYLATE 5 MG PO TABS Oral Take 5 mg by mouth 2 (two) times daily.     Marland Kitchen BENZONATATE 100 MG PO CAPS Oral Take 100 mg by mouth 3 (three) times daily as needed.     . CEPHALEXIN 500 MG PO CAPS Oral Take 1 capsule (500 mg total) by mouth 4 (four) times daily. 40 capsule 0  . VITAMIN D 1000 UNITS PO TABS Oral Take 2,000 Units by mouth daily.     Marland Kitchen U-LACTIN EX Apply externally Apply topically daily as needed. As needed for rash    . FEBUXOSTAT 40 MG PO TABS Oral Take 80 mg by mouth daily.     . OMEGA-3 FATTY ACIDS 1000 MG PO CAPS Oral Take 2 g by mouth daily.     Marland Kitchen FOLIC ACID 1 MG PO TABS Oral Take 1 mg by mouth daily.     Marland Kitchen HYDRALAZINE HCL 25 MG PO TABS Oral Take 25 mg by mouth 3 (three) times  daily.     Marland Kitchen HYDROCODONE-ACETAMINOPHEN 5-325 MG PO TABS Oral Take 1 tablet by mouth every 6 (six) hours as needed. As needed for pain    . NEBIVOLOL HCL 10 MG PO TABS Oral Take 10 mg by mouth daily.     . TELMISARTAN 80 MG PO TABS Oral Take 80 mg by mouth daily.     . TORSEMIDE 10 MG PO TABS Oral Take 10 mg by mouth every other day.      Marland Kitchen NITROFURANTOIN MACROCRYSTAL 100 MG PO CAPS Oral Take 100 mg by mouth 2 (two) times daily.      . OXYCODONE-ACETAMINOPHEN 5-325 MG PO TABS Oral Take 1 tablet by mouth every 4 (four) hours as needed for pain. 10 tablet 0    BP 146/63  Pulse 65  Temp 97.9 F (36.6 C) (Oral)  Resp 17  SpO2 100%  Physical Exam  Nursing note and vitals reviewed. Constitutional: She appears well-developed and well-nourished. No distress.  HENT:  Mouth/Throat: Oropharynx is clear and moist.  Eyes:  Conjunctivae normal are normal. No scleral icterus.  Neck: Neck supple. No tracheal deviation present.  Cardiovascular: Normal rate, regular rhythm, normal heart sounds and intact distal pulses.   Pulmonary/Chest: Effort normal and breath sounds normal. No respiratory distress.  Abdominal: Soft. Normal appearance and bowel sounds are normal. She exhibits no distension and no mass. There is no tenderness. There is no rebound and no guarding.  Genitourinary:       No cva tenderness  Musculoskeletal: She exhibits no edema and no tenderness.  Neurological: She is alert.  Skin: Skin is warm and dry. No rash noted.  Psychiatric: She has a normal mood and affect.    ED Course  Procedures (including critical care time)   Labs Reviewed  CBC WITH DIFFERENTIAL  COMPREHENSIVE METABOLIC PANEL  LIPASE, BLOOD  URINALYSIS, MICROSCOPIC ONLY   Results for orders placed during the hospital encounter of 07/17/12  CBC WITH DIFFERENTIAL      Component Value Range   WBC 7.5  4.0 - 10.5 K/uL   RBC 3.29 (*) 3.87 - 5.11 MIL/uL   Hemoglobin 10.7 (*) 12.0 - 15.0 g/dL   HCT 31.9 (*) 36.0 - 46.0 %   MCV 97.0  78.0 - 100.0 fL   MCH 32.5  26.0 - 34.0 pg   MCHC 33.5  30.0 - 36.0 g/dL   RDW 13.2  11.5 - 15.5 %   Platelets 148 (*) 150 - 400 K/uL   Neutrophils Relative 74  43 - 77 %   Neutro Abs 5.5  1.7 - 7.7 K/uL   Lymphocytes Relative 18  12 - 46 %   Lymphs Abs 1.3  0.7 - 4.0 K/uL   Monocytes Relative 7  3 - 12 %   Monocytes Absolute 0.6  0.1 - 1.0 K/uL   Eosinophils Relative 1  0 - 5 %   Eosinophils Absolute 0.1  0.0 - 0.7 K/uL   Basophils Relative 0  0 - 1 %   Basophils Absolute 0.0  0.0 - 0.1 K/uL  COMPREHENSIVE METABOLIC PANEL      Component Value Range   Sodium 138  135 - 145 mEq/L   Potassium 4.5  3.5 - 5.1 mEq/L   Chloride 102  96 - 112 mEq/L   CO2 25  19 - 32 mEq/L   Glucose, Bld 101 (*) 70 - 99 mg/dL   BUN 40 (*) 6 - 23 mg/dL   Creatinine, Ser 2.22 (*)  0.50 - 1.10 mg/dL   Calcium 10.3   8.4 - 10.5 mg/dL   Total Protein 7.0  6.0 - 8.3 g/dL   Albumin 3.4 (*) 3.5 - 5.2 g/dL   AST 13  0 - 37 U/L   ALT 9  0 - 35 U/L   Alkaline Phosphatase 58  39 - 117 U/L   Total Bilirubin 0.2 (*) 0.3 - 1.2 mg/dL   GFR calc non Af Amer 21 (*) >90 mL/min   GFR calc Af Amer 24 (*) >90 mL/min  LIPASE, BLOOD      Component Value Range   Lipase 18  11 - 59 U/L  URINALYSIS, MICROSCOPIC ONLY      Component Value Range   Color, Urine YELLOW  YELLOW   APPearance TURBID (*) CLEAR   Specific Gravity, Urine 1.018  1.005 - 1.030   pH 6.5  5.0 - 8.0   Glucose, UA NEGATIVE  NEGATIVE mg/dL   Hgb urine dipstick LARGE (*) NEGATIVE   Bilirubin Urine NEGATIVE  NEGATIVE   Ketones, ur NEGATIVE  NEGATIVE mg/dL   Protein, ur >300 (*) NEGATIVE mg/dL   Urobilinogen, UA 0.2  0.0 - 1.0 mg/dL   Nitrite NEGATIVE  NEGATIVE   Leukocytes, UA LARGE (*) NEGATIVE   WBC, UA TOO NUMEROUS TO COUNT  <3 WBC/hpf   Urine-Other FIELD OBSCURED BY WBC'S         MDM  Iv ns. Fentanyl in triage. Labs.  ua positive for uti. u culture added.   Rocephin iv.   abd soft nt.  Reviewed nursing notes and prior charts for additional history.   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 Pt did have uretereoscopy/stent post one of those cts - of note, no stone seen.           Mirna Mires, MD 07/17/12 1235

## 2012-07-17 NOTE — ED Notes (Signed)
Pt presenting to ed with c/o left side flank pain that radiates into her back pt states onset at 4:00am pt states it feels like her previous kidney stone pain. Pt denies nausea, vomiting and diarrhea. Pt states dysuria pt denies hematuria.

## 2012-07-17 NOTE — Progress Notes (Signed)
Pt states pcp is [facility] epic updated

## 2012-07-19 LAB — URINE CULTURE

## 2012-11-05 ENCOUNTER — Emergency Department (HOSPITAL_COMMUNITY): Payer: PRIVATE HEALTH INSURANCE

## 2012-11-05 ENCOUNTER — Encounter (HOSPITAL_COMMUNITY): Payer: Self-pay

## 2012-11-05 ENCOUNTER — Emergency Department (HOSPITAL_COMMUNITY)
Admission: EM | Admit: 2012-11-05 | Discharge: 2012-11-05 | Disposition: A | Discharge status: Home / Self Care | Payer: PRIVATE HEALTH INSURANCE | Attending: Emergency Medicine | Admitting: Emergency Medicine

## 2012-11-05 DIAGNOSIS — M542 Cervicalgia: Secondary | ICD-10-CM | POA: Insufficient documentation

## 2012-11-05 DIAGNOSIS — Z87442 Personal history of urinary calculi: Secondary | ICD-10-CM | POA: Insufficient documentation

## 2012-11-05 DIAGNOSIS — M109 Gout, unspecified: Secondary | ICD-10-CM | POA: Insufficient documentation

## 2012-11-05 DIAGNOSIS — Z87448 Personal history of other diseases of urinary system: Secondary | ICD-10-CM | POA: Insufficient documentation

## 2012-11-05 DIAGNOSIS — Z950 Presence of cardiac pacemaker: Secondary | ICD-10-CM | POA: Insufficient documentation

## 2012-11-05 DIAGNOSIS — Z8679 Personal history of other diseases of the circulatory system: Secondary | ICD-10-CM | POA: Insufficient documentation

## 2012-11-05 DIAGNOSIS — M436 Torticollis: Secondary | ICD-10-CM | POA: Insufficient documentation

## 2012-11-05 DIAGNOSIS — Z8739 Personal history of other diseases of the musculoskeletal system and connective tissue: Secondary | ICD-10-CM | POA: Insufficient documentation

## 2012-11-05 DIAGNOSIS — I2510 Atherosclerotic heart disease of native coronary artery without angina pectoris: Secondary | ICD-10-CM | POA: Insufficient documentation

## 2012-11-05 DIAGNOSIS — Z79899 Other long term (current) drug therapy: Secondary | ICD-10-CM | POA: Insufficient documentation

## 2012-11-05 HISTORY — DX: Presence of cardiac pacemaker: Z95.0

## 2012-11-05 MED ORDER — DIAZEPAM 5 MG PO TABS: 5.0000 mg | ORAL_TABLET | Freq: Four times a day (QID) | ORAL | Status: DC | PRN

## 2012-11-05 MED ORDER — HYDROMORPHONE HCL PF 1 MG/ML IJ SOLN
1.0000 mg | Freq: Once | INTRAMUSCULAR | Status: AC
  Administered 2012-11-05: 1 mg via INTRAMUSCULAR
  Filled 2012-11-05: qty 1

## 2012-11-05 MED ORDER — DIAZEPAM 5 MG/ML IJ SOLN: 5.0000 mg | Freq: Once | INTRAMUSCULAR | Status: DC

## 2012-11-05 MED ORDER — IBUPROFEN 800 MG PO TABS: 800.0000 mg | ORAL_TABLET | Freq: Three times a day (TID) | ORAL | Status: DC

## 2012-11-05 MED ORDER — OXYCODONE-ACETAMINOPHEN 5-325 MG PO TABS: 2.0000 | ORAL_TABLET | ORAL | Status: DC | PRN

## 2012-11-05 MED ORDER — SODIUM CHLORIDE 0.9 % IV BOLUS (SEPSIS): 1000.0000 mL | Freq: Once | INTRAVENOUS | Status: DC

## 2012-11-05 MED ORDER — DIAZEPAM 5 MG/ML IJ SOLN
5.0000 mg | Freq: Once | INTRAMUSCULAR | Status: AC
  Administered 2012-11-05: 5 mg via INTRAMUSCULAR
  Filled 2012-11-05: qty 2

## 2012-11-05 MED ORDER — KETOROLAC TROMETHAMINE 15 MG/ML IJ SOLN
15.0000 mg | Freq: Once | INTRAMUSCULAR | Status: DC
  Filled 2012-11-05: qty 1

## 2012-11-05 MED ORDER — MORPHINE SULFATE 4 MG/ML IJ SOLN: 6.0000 mg | Freq: Once | INTRAMUSCULAR | Status: DC

## 2012-11-05 MED ORDER — KETOROLAC TROMETHAMINE 30 MG/ML IJ SOLN
30.0000 mg | Freq: Once | INTRAMUSCULAR | Status: AC
  Administered 2012-11-05: 30 mg via INTRAMUSCULAR
  Filled 2012-11-05: qty 1

## 2012-11-05 NOTE — ED Notes (Signed)
Discharge instructions reviewed w/ pt., verbalizes understanding. Three prescriptions provided at discharged.

## 2012-11-05 NOTE — ED Provider Notes (Signed)
History     CSN: [id number]  Arrival date & time 11/05/12  1150   First MD Initiated Contact with Patient 11/05/12 1246      Chief Complaint  Patient presents with  . Shoulder Pain    (Consider location/radiation/quality/duration/timing/severity/associated sxs/prior treatment) The history is provided by the patient.  BRINAE REINHOLTZ is a 77 y.o. female history of arthritis, hypertension, renal insufficiency here with right-sided neck and shoulder pain for the last week. She said he gradually got worse over the last week. No injuries or falls. Unable to turn her head to the right side due to pain. Had similar episodes before. No chest pain or shortness of breath or fevers or chills.   Past Medical History  Diagnosis Date  . Arthritis   . Renal disorder     kidney stones  . Renal insufficiency   . Bradycardia   . Hypertension   . Gout   . Coronary artery disease   . Pacemaker     Past Surgical History  Procedure Laterality Date  . Abdominal hysterectomy    . Joint replacement      knee replacement  . Arm surgery    . Tummy tuck    . Shoulder surgery    . Pacemaker insertion      No family history on file.  History  Substance Use Topics  . Smoking status: Never Smoker   . Smokeless tobacco: Not on file  . Alcohol Use: Yes     Comment: occasional    OB History   Grav Para Term Preterm Abortions TAB SAB Ect Mult Living                  Review of Systems  Musculoskeletal:       R shoulder and neck pain   All other systems reviewed and are negative.    Allergies  Toprol xl  Home Medications   Current Outpatient Rx  Name  Route  Sig  Dispense  Refill  . amLODipine (NORVASC) 5 MG tablet   Oral   Take 5 mg by mouth 2 (two) times daily.          . cholecalciferol (VITAMIN D) 1000 UNITS tablet   Oral   Take 2,000 Units by mouth every morning.          . febuxostat (ULORIC) 40 MG tablet   Oral   Take 40 mg by mouth every morning.          .  fish oil-omega-3 fatty acids 1000 MG capsule   Oral   Take 2 g by mouth every morning.          . folic acid (FOLVITE) 1 MG tablet   Oral   Take 1 mg by mouth every morning.          . hydrALAZINE (APRESOLINE) 25 MG tablet   Oral   Take 25 mg by mouth 2 (two) times daily.          . nebivolol (BYSTOLIC) 10 MG tablet   Oral   Take 10 mg by mouth every morning.          Marland Kitchen omeprazole (PRILOSEC) 20 MG capsule   Oral   Take 20 mg by mouth every morning.         . potassium chloride (K-DUR) 10 MEQ tablet   Oral   Take 10 mEq by mouth 2 (two) times daily.         . pravastatin (  PRAVACHOL) 20 MG tablet   Oral   Take 20 mg by mouth at bedtime.         Marland Kitchen telmisartan (MICARDIS) 40 MG tablet   Oral   Take 40 mg by mouth every morning.         . torsemide (DEMADEX) 10 MG tablet   Oral   Take 10 mg by mouth every morning.            BP 144/64  Pulse 65  Temp(Src) 98.3 F (36.8 C) (Oral)  Ht 5\' 5"  (1.651 m)  Wt 242 lb (109.77 kg)  BMI 40.27 kg/m2  SpO2 100%  Physical Exam  Nursing note and vitals reviewed. Constitutional: She is oriented to person, place, and time. She appears well-nourished.  Uncomfortable. Head turned to L side.   HENT:  Head: Normocephalic.  Mouth/Throat: Oropharynx is clear and moist.  Eyes: Conjunctivae are normal. Pupils are equal, round, and reactive to light.  Neck:  Dec ROM due to pain. Mild tenderness over R SCM.   Cardiovascular: Normal rate, regular rhythm and normal heart sounds.   Pulmonary/Chest: Effort normal and breath sounds normal. No respiratory distress. She has no wheezes. She has no rales.  Abdominal: Soft. Bowel sounds are normal. She exhibits no distension. There is no tenderness. There is no rebound.  Musculoskeletal:  + tenderness over R trapezius muscle with muscle spasm. No tenderness over shoulder joint. NL ROM of R shoulder. R arm neurovascular exam nl.   Neurological: She is alert and oriented to person,  place, and time.  Skin: Skin is warm.  Psychiatric: She has a normal mood and affect. Her behavior is normal. Judgment and thought content normal.    ED Course  Procedures (including critical care time)  Labs Reviewed - No data to display No results found.   No diagnosis found.    MDM  KEALYN ALBERTELLI is a 77 y.o. female here with R neck muscle spasms, likely torticolis. Will give pain meds, valium, and reassess.   3:40 PM Patient felt better. Able to range the neck more. Will d/c home with percocet, valium, and motrin.        Wandra Arthurs, MD 11/05/12 1540

## 2012-11-05 NOTE — ED Notes (Signed)
Pt complaints of right shoulder pain with limited mobility and pain at movement.  Started Friday. Denied injury, previous hx of same a long time ago.

## 2013-04-25 ENCOUNTER — Encounter (HOSPITAL_COMMUNITY): Payer: Self-pay | Admitting: Emergency Medicine

## 2013-04-25 ENCOUNTER — Emergency Department (HOSPITAL_COMMUNITY)
Admission: EM | Admit: 2013-04-25 | Discharge: 2013-04-25 | Disposition: A | Discharge status: Home / Self Care | Payer: PRIVATE HEALTH INSURANCE | Attending: Emergency Medicine | Admitting: Emergency Medicine

## 2013-04-25 ENCOUNTER — Emergency Department (HOSPITAL_COMMUNITY): Payer: PRIVATE HEALTH INSURANCE

## 2013-04-25 DIAGNOSIS — Z8679 Personal history of other diseases of the circulatory system: Secondary | ICD-10-CM | POA: Insufficient documentation

## 2013-04-25 DIAGNOSIS — Z950 Presence of cardiac pacemaker: Secondary | ICD-10-CM | POA: Insufficient documentation

## 2013-04-25 DIAGNOSIS — R0602 Shortness of breath: Secondary | ICD-10-CM | POA: Insufficient documentation

## 2013-04-25 DIAGNOSIS — Z791 Long term (current) use of non-steroidal anti-inflammatories (NSAID): Secondary | ICD-10-CM | POA: Insufficient documentation

## 2013-04-25 DIAGNOSIS — Z79899 Other long term (current) drug therapy: Secondary | ICD-10-CM | POA: Insufficient documentation

## 2013-04-25 DIAGNOSIS — I10 Essential (primary) hypertension: Secondary | ICD-10-CM | POA: Insufficient documentation

## 2013-04-25 DIAGNOSIS — M129 Arthropathy, unspecified: Secondary | ICD-10-CM | POA: Insufficient documentation

## 2013-04-25 DIAGNOSIS — R609 Edema, unspecified: Secondary | ICD-10-CM | POA: Insufficient documentation

## 2013-04-25 DIAGNOSIS — R079 Chest pain, unspecified: Secondary | ICD-10-CM

## 2013-04-25 DIAGNOSIS — R0789 Other chest pain: Secondary | ICD-10-CM | POA: Insufficient documentation

## 2013-04-25 DIAGNOSIS — Z87448 Personal history of other diseases of urinary system: Secondary | ICD-10-CM | POA: Insufficient documentation

## 2013-04-25 DIAGNOSIS — M109 Gout, unspecified: Secondary | ICD-10-CM | POA: Insufficient documentation

## 2013-04-25 DIAGNOSIS — I2510 Atherosclerotic heart disease of native coronary artery without angina pectoris: Secondary | ICD-10-CM | POA: Insufficient documentation

## 2013-04-25 DIAGNOSIS — Z96659 Presence of unspecified artificial knee joint: Secondary | ICD-10-CM | POA: Insufficient documentation

## 2013-04-25 DIAGNOSIS — Z87442 Personal history of urinary calculi: Secondary | ICD-10-CM | POA: Insufficient documentation

## 2013-04-25 LAB — POCT I-STAT, CHEM 8
Creatinine, Ser: 2.4 mg/dL — ABNORMAL HIGH (ref 0.50–1.10)
Hemoglobin: 10.2 g/dL — ABNORMAL LOW (ref 12.0–15.0)
Sodium: 141 mEq/L (ref 135–145)
TCO2: 23 mmol/L (ref 0–100)

## 2013-04-25 LAB — CBC
MCH: 31 pg (ref 26.0–34.0)
Platelets: 152 10*3/uL (ref 150–400)
RBC: 3.19 MIL/uL — ABNORMAL LOW (ref 3.87–5.11)
WBC: 5.3 10*3/uL (ref 4.0–10.5)

## 2013-04-25 LAB — BASIC METABOLIC PANEL
CO2: 25 mEq/L (ref 19–32)
Calcium: 10 mg/dL (ref 8.4–10.5)
Chloride: 105 mEq/L (ref 96–112)
Potassium: 5.3 mEq/L — ABNORMAL HIGH (ref 3.5–5.1)
Sodium: 138 mEq/L (ref 135–145)

## 2013-04-25 LAB — TROPONIN I: Troponin I: <0.3 ng/mL (ref <0.30)

## 2013-04-25 LAB — POCT I-STAT TROPONIN I: Troponin i, poc: 0.03 ng/mL (ref 0.00–0.08)

## 2013-04-25 LAB — PRO B NATRIURETIC PEPTIDE: Pro B Natriuretic peptide (BNP): 1312 pg/mL — ABNORMAL HIGH (ref 0–450)

## 2013-04-25 MED ORDER — FUROSEMIDE 20 MG PO TABS: 20.0000 mg | ORAL_TABLET | Freq: Every day | ORAL | Status: DC

## 2013-04-25 MED ORDER — GI COCKTAIL ~~LOC~~
30.0000 mL | Freq: Once | ORAL | Status: AC
  Administered 2013-04-25: 30 mL via ORAL
  Filled 2013-04-25: qty 30

## 2013-04-25 MED ORDER — OXYCODONE-ACETAMINOPHEN 5-325 MG PO TABS
2.0000 | ORAL_TABLET | Freq: Once | ORAL | Status: AC
  Administered 2013-04-25: 2 via ORAL
  Filled 2013-04-25 (×2): qty 1

## 2013-04-25 MED ORDER — PANTOPRAZOLE SODIUM 20 MG PO TBEC: 20.0000 mg | DELAYED_RELEASE_TABLET | Freq: Every day | ORAL | Status: DC

## 2013-04-25 MED ORDER — ASPIRIN 325 MG PO TABS
325.0000 mg | ORAL_TABLET | ORAL | Status: DC
  Filled 2013-04-25: qty 1

## 2013-04-25 NOTE — ED Provider Notes (Signed)
CSN: [id number]     Arrival date & time 04/25/13  1210 History     First MD Initiated Contact with Patient 04/25/13 1214     Chief Complaint  Patient presents with  . Chest Pain  . Shortness of Breath  . Edema   (Consider location/radiation/quality/duration/timing/severity/associated sxs/prior Treatment) HPI 77 y.o. Female complaining of chest pain for one month.  States pain is present all the time.  Pain is in anterior chest with radiation bilaterally described as pressure.  Patient states pain has been present constantly with no increasing or relieving factors.  She has had a cardiac evaluation by her cardiologist in [place].  She was seen about two weeks ago. She denies fever or chills.  Past Medical History  Diagnosis Date  . Arthritis   . Renal disorder     kidney stones  . Renal insufficiency   . Bradycardia   . Hypertension   . Gout   . Coronary artery disease   . Pacemaker    Past Surgical History  Procedure Laterality Date  . Abdominal hysterectomy    . Joint replacement      knee replacement  . Arm surgery    . Tummy tuck    . Shoulder surgery    . Pacemaker insertion     No family history on file. History  Substance Use Topics  . Smoking status: Never Smoker   . Smokeless tobacco: Not on file  . Alcohol Use: Yes     Comment: occasional   OB History   Grav Para Term Preterm Abortions TAB SAB Ect Mult Living                 Review of Systems  All other systems reviewed and are negative.    Allergies  Toprol xl  Home Medications   Current Outpatient Rx  Name  Route  Sig  Dispense  Refill  . amLODipine (NORVASC) 5 MG tablet   Oral   Take 5 mg by mouth 2 (two) times daily.          . cholecalciferol (VITAMIN D) 1000 UNITS tablet   Oral   Take 2,000 Units by mouth every morning.          . diazepam (VALIUM) 5 MG tablet   Oral   Take 1 tablet (5 mg total) by mouth every 6 (six) hours as needed for anxiety (spasms).   10 tablet    0   . febuxostat (ULORIC) 40 MG tablet   Oral   Take 40 mg by mouth every morning.          . fish oil-omega-3 fatty acids 1000 MG capsule   Oral   Take 2 g by mouth every morning.          . folic acid (FOLVITE) 1 MG tablet   Oral   Take 1 mg by mouth every morning.          . hydrALAZINE (APRESOLINE) 25 MG tablet   Oral   Take 25 mg by mouth 2 (two) times daily.          Marland Kitchen ibuprofen (ADVIL,MOTRIN) 800 MG tablet   Oral   Take 1 tablet (800 mg total) by mouth 3 (three) times daily.   21 tablet   0   . nebivolol (BYSTOLIC) 10 MG tablet   Oral   Take 10 mg by mouth every morning.          Marland Kitchen omeprazole (PRILOSEC) 20  MG capsule   Oral   Take 20 mg by mouth every morning.         Marland Kitchen oxyCODONE-acetaminophen (PERCOCET) 5-325 MG per tablet   Oral   Take 2 tablets by mouth every 4 (four) hours as needed for pain.   10 tablet   0   . potassium chloride (K-DUR) 10 MEQ tablet   Oral   Take 10 mEq by mouth 2 (two) times daily.         . pravastatin (PRAVACHOL) 20 MG tablet   Oral   Take 20 mg by mouth at bedtime.         Marland Kitchen telmisartan (MICARDIS) 40 MG tablet   Oral   Take 40 mg by mouth every morning.         . torsemide (DEMADEX) 10 MG tablet   Oral   Take 10 mg by mouth every morning.           BP 166/61  Pulse 70  Temp(Src) 97.9 F (36.6 C) (Oral)  Resp 24  SpO2 100% Physical Exam  Nursing note and vitals reviewed. Constitutional: She is oriented to person, place, and time. She appears well-developed and well-nourished.  HENT:  Head: Normocephalic and atraumatic.  Right Ear: External ear normal.  Left Ear: External ear normal.  Nose: Nose normal.  Mouth/Throat: Oropharynx is clear and moist.  Eyes: Conjunctivae and EOM are normal. Pupils are equal, round, and reactive to light.  Neck: Normal range of motion. Neck supple.  Cardiovascular: Normal rate, regular rhythm, normal heart sounds and intact distal pulses.   Pulmonary/Chest:  Effort normal and breath sounds normal.  Abdominal: Soft. Bowel sounds are normal.  Epigastric ttp  Musculoskeletal: She exhibits edema.  Bilateral 1+ edema lower extremities  Neurological: She is alert and oriented to person, place, and time. She has normal reflexes.  Skin: Skin is warm and dry.  Psychiatric: She has a normal mood and affect. Her behavior is normal. Judgment and thought content normal.    ED Course   Procedures (including critical care time)  Labs Reviewed  CBC - Abnormal; Notable for the following:    RBC 3.19 (*)    Hemoglobin 9.9 (*)    HCT 30.1 (*)    All other components within normal limits  BASIC METABOLIC PANEL  PRO B NATRIURETIC PEPTIDE  TROPONIN I   No results found. No diagnosis found.  Date: 04/25/2013  Rate: 78  Rhythm: normal sinus rhythm  QRS Axis: normal  Intervals: normal  ST/T Wave abnormalities: normal  Conduction Disutrbances: none  Narrative Interpretation: unremarkable     MDM  Patient with normal ekg and pain present constantly for one month would indicate low index of suspicion for cad.  She improved here with gi cocktail.  She does not appear to have chf but is uncomfortable with lower extremity edema. She is give rx for protonix and is given rx for short term lasix. She is advised close follow up with her cardiologist.   Shaune Pollack, MD 04/25/13 [phone]

## 2013-04-25 NOTE — ED Notes (Addendum)
Pt reports centralized/generalized chest pain that began last night. Pt also reports shortness of breath, dizziness, nausea, lower back pain, and mild edema to the face and lower legs. Pt denies taking any aspirin today, however normally takes 81 mg per day. Pt also reports having a non-productive cough for two months, however reports congestion. Pt has a pacemaker, however is a poor historian regarding her cardiac history.

## 2013-04-25 NOTE — ED Notes (Signed)
Pt escorted to discharge window. Pt verbalized understanding discharge instructions. In no acute distress.  

## 2013-09-09 ENCOUNTER — Encounter (HOSPITAL_COMMUNITY): Payer: Self-pay | Admitting: Emergency Medicine

## 2013-09-09 ENCOUNTER — Emergency Department (HOSPITAL_COMMUNITY)
Admission: EM | Admit: 2013-09-09 | Discharge: 2013-09-09 | Disposition: A | Discharge status: Home / Self Care | Payer: PRIVATE HEALTH INSURANCE | Attending: Emergency Medicine | Admitting: Emergency Medicine

## 2013-09-09 ENCOUNTER — Emergency Department (HOSPITAL_COMMUNITY): Payer: PRIVATE HEALTH INSURANCE

## 2013-09-09 DIAGNOSIS — I129 Hypertensive chronic kidney disease with stage 1 through stage 4 chronic kidney disease, or unspecified chronic kidney disease: Secondary | ICD-10-CM | POA: Insufficient documentation

## 2013-09-09 DIAGNOSIS — Z950 Presence of cardiac pacemaker: Secondary | ICD-10-CM | POA: Insufficient documentation

## 2013-09-09 DIAGNOSIS — Z9889 Other specified postprocedural states: Secondary | ICD-10-CM | POA: Insufficient documentation

## 2013-09-09 DIAGNOSIS — M25469 Effusion, unspecified knee: Secondary | ICD-10-CM | POA: Insufficient documentation

## 2013-09-09 DIAGNOSIS — D689 Coagulation defect, unspecified: Secondary | ICD-10-CM | POA: Insufficient documentation

## 2013-09-09 DIAGNOSIS — Y929 Unspecified place or not applicable: Secondary | ICD-10-CM | POA: Insufficient documentation

## 2013-09-09 DIAGNOSIS — Z79899 Other long term (current) drug therapy: Secondary | ICD-10-CM | POA: Insufficient documentation

## 2013-09-09 DIAGNOSIS — Z7901 Long term (current) use of anticoagulants: Secondary | ICD-10-CM | POA: Insufficient documentation

## 2013-09-09 DIAGNOSIS — Y939 Activity, unspecified: Secondary | ICD-10-CM | POA: Insufficient documentation

## 2013-09-09 DIAGNOSIS — Z87442 Personal history of urinary calculi: Secondary | ICD-10-CM | POA: Insufficient documentation

## 2013-09-09 DIAGNOSIS — M129 Arthropathy, unspecified: Secondary | ICD-10-CM | POA: Insufficient documentation

## 2013-09-09 DIAGNOSIS — M109 Gout, unspecified: Secondary | ICD-10-CM | POA: Insufficient documentation

## 2013-09-09 DIAGNOSIS — R531 Weakness: Secondary | ICD-10-CM

## 2013-09-09 DIAGNOSIS — R296 Repeated falls: Secondary | ICD-10-CM | POA: Insufficient documentation

## 2013-09-09 DIAGNOSIS — Z7982 Long term (current) use of aspirin: Secondary | ICD-10-CM | POA: Insufficient documentation

## 2013-09-09 DIAGNOSIS — D649 Anemia, unspecified: Secondary | ICD-10-CM | POA: Insufficient documentation

## 2013-09-09 DIAGNOSIS — N189 Chronic kidney disease, unspecified: Secondary | ICD-10-CM | POA: Insufficient documentation

## 2013-09-09 DIAGNOSIS — I2510 Atherosclerotic heart disease of native coronary artery without angina pectoris: Secondary | ICD-10-CM | POA: Insufficient documentation

## 2013-09-09 DIAGNOSIS — M549 Dorsalgia, unspecified: Secondary | ICD-10-CM | POA: Insufficient documentation

## 2013-09-09 DIAGNOSIS — R5381 Other malaise: Secondary | ICD-10-CM | POA: Insufficient documentation

## 2013-09-09 DIAGNOSIS — G8929 Other chronic pain: Secondary | ICD-10-CM | POA: Insufficient documentation

## 2013-09-09 HISTORY — DX: Other symptoms and signs involving the musculoskeletal system: R29.898

## 2013-09-09 LAB — CBC WITH DIFFERENTIAL/PLATELET
Basophils Absolute: 0 10*3/uL (ref 0.0–0.1)
Eosinophils Absolute: 0.1 10*3/uL (ref 0.0–0.7)
Eosinophils Relative: 1 % (ref 0–5)
HCT: 29 % — ABNORMAL LOW (ref 36.0–46.0)
Hemoglobin: 9.6 g/dL — ABNORMAL LOW (ref 12.0–15.0)
Lymphocytes Relative: 16 % (ref 12–46)
Lymphs Abs: 0.9 10*3/uL (ref 0.7–4.0)
MCH: 30.9 pg (ref 26.0–34.0)
MCHC: 33.1 g/dL (ref 30.0–36.0)
MCV: 93.2 fL (ref 78.0–100.0)
Monocytes Absolute: 0.3 10*3/uL (ref 0.1–1.0)
Monocytes Relative: 6 % (ref 3–12)
Neutrophils Relative %: 77 % (ref 43–77)
Platelets: 141 10*3/uL — ABNORMAL LOW (ref 150–400)
RBC: 3.11 MIL/uL — ABNORMAL LOW (ref 3.87–5.11)
RDW: 15 % (ref 11.5–15.5)
WBC: 5.8 10*3/uL (ref 4.0–10.5)

## 2013-09-09 LAB — COMPREHENSIVE METABOLIC PANEL
ALT: 13 U/L (ref 0–35)
AST: 26 U/L (ref 0–37)
Alkaline Phosphatase: 81 U/L (ref 39–117)
Calcium: 10 mg/dL (ref 8.4–10.5)
Creatinine, Ser: 2.08 mg/dL — ABNORMAL HIGH (ref 0.50–1.10)
GFR calc Af Amer: 26 mL/min — ABNORMAL LOW (ref >90)
Glucose, Bld: 108 mg/dL — ABNORMAL HIGH (ref 70–99)
Potassium: 4.8 mEq/L (ref 3.5–5.1)
Sodium: 142 mEq/L (ref 135–145)
Total Bilirubin: 0.3 mg/dL (ref 0.3–1.2)
Total Protein: 7.1 g/dL (ref 6.0–8.3)

## 2013-09-09 LAB — URINALYSIS, ROUTINE W REFLEX MICROSCOPIC
Glucose, UA: NEGATIVE mg/dL
Leukocytes, UA: NEGATIVE
Nitrite: NEGATIVE
Specific Gravity, Urine: 1.014 (ref 1.005–1.030)
pH: 5.5 (ref 5.0–8.0)

## 2013-09-09 LAB — APTT: aPTT: 41 seconds — ABNORMAL HIGH (ref 24–37)

## 2013-09-09 LAB — CK TOTAL AND CKMB (NOT AT ARMC): Relative Index: 0.9 (ref 0.0–2.5)

## 2013-09-09 LAB — PROTIME-INR
INR: 3.17 — ABNORMAL HIGH (ref 0.00–1.49)
Prothrombin Time: 31.4 seconds — ABNORMAL HIGH (ref 11.6–15.2)

## 2013-09-09 LAB — TROPONIN I: Troponin I: <0.3 ng/mL (ref <0.30)

## 2013-09-09 MED ORDER — METHOCARBAMOL 500 MG PO TABS: 500.0000 mg | ORAL_TABLET | Freq: Three times a day (TID) | ORAL | Status: DC | PRN

## 2013-09-09 MED ORDER — METHOCARBAMOL 500 MG PO TABS
750.0000 mg | ORAL_TABLET | Freq: Once | ORAL | Status: AC
  Administered 2013-09-09: 750 mg via ORAL
  Filled 2013-09-09: qty 2

## 2013-09-09 NOTE — ED Provider Notes (Signed)
CSN: [id number]     Arrival date & time 09/09/13  1429 History   First MD Initiated Contact with Patient 09/09/13 1500     Chief Complaint  Patient presents with  . Extremity Weakness   (Consider location/radiation/quality/duration/timing/severity/associated sxs/prior Treatment) HPI Patient reports she has had bilateral knee replacement surgery done, the last was in the early 2000. She reports she has been having trouble with her knees buckling without warning. She denies feeling dizziness or lightheadedness before she falls. She states last night she was at her sister's house and she had her knee buckle and she fell. Her family was able to get her up. They'll went to bed and she was sleeping in a chair. She tried to get up at 10:30 to go to the bathroom and she fell again. She did not want to disturb her family and she laid on the floor until 7 AM this morning when her grandson arrived and he helped her get up. She did however scoot into another room and was trying to get herself up using a chair. She reports she had been straining the first time and the second time to help get herself up by trying to pull herself up on the seat of a chair. She complains of a lot of pain in her back since she did that. She did not fall and hit her back, she states her muscles store from the pulling herself off. She denies hitting her head or having loss of consciousness. She denies chest pain, nausea, vomiting. She states she has a cane and a walker however she does not use them on a regular basis. Her orthopedist are in [facility].   PCP Dr Lovena Le in [facility] has appt tomorrow  Past Medical History  Diagnosis Date  . Arthritis   . Renal disorder     kidney stones  . Renal insufficiency   . Bradycardia   . Hypertension   . Gout   . Coronary artery disease   . Pacemaker   . Leg weakness, bilateral    Past Surgical History  Procedure Laterality Date  . Abdominal hysterectomy    . Joint replacement      knee  replacement  . Arm surgery    . Tummy tuck    . Shoulder surgery    . Pacemaker insertion    . Replacement total knee bilateral     History reviewed. No pertinent family history. History  Substance Use Topics  . Smoking status: Never Smoker   . Smokeless tobacco: Not on file  . Alcohol Use: Yes     Comment: occasional   Lives at home Lives alone   OB History   Grav Para Term Preterm Abortions TAB SAB Ect Mult Living                 Review of Systems  All other systems reviewed and are negative.    Allergies  Toprol xl  Home Medications   Current Outpatient Rx  Name  Route  Sig  Dispense  Refill  . amiodarone (PACERONE) 200 MG tablet   Oral   Take 200 mg by mouth daily.         Marland Kitchen aspirin EC 81 MG tablet   Oral   Take 81 mg by mouth every morning.         . cholecalciferol (VITAMIN D) 1000 UNITS tablet   Oral   Take 2,000 Units by mouth every morning.          Marland Kitchen  febuxostat (ULORIC) 40 MG tablet   Oral   Take 40 mg by mouth every morning.          . fish oil-omega-3 fatty acids 1000 MG capsule   Oral   Take 2 g by mouth every morning.          . folic acid (FOLVITE) 1 MG tablet   Oral   Take 1 mg by mouth every morning.          . furosemide (LASIX) 20 MG tablet   Oral   Take 20 mg by mouth every Monday, Wednesday, and Friday.         . furosemide (LASIX) 20 MG tablet   Oral   Take 1 tablet (20 mg total) by mouth daily.   10 tablet   0   . hydrALAZINE (APRESOLINE) 50 MG tablet   Oral   Take 50 mg by mouth 3 (three) times daily.         . nebivolol (BYSTOLIC) 10 MG tablet   Oral   Take 10 mg by mouth every morning.          Marland Kitchen omeprazole (PRILOSEC) 40 MG capsule   Oral   Take 40 mg by mouth every morning.         . pravastatin (PRAVACHOL) 10 MG tablet   Oral   Take 10 mg by mouth at bedtime.         Marland Kitchen telmisartan (MICARDIS) 40 MG tablet   Oral   Take 40 mg by mouth every morning.         . warfarin (COUMADIN)  5 MG tablet   Oral   Take 2.5-5 mg by mouth See admin instructions. Take 1 tablet (5mg ) every other day, alternating with 1/2 tab (2.5mg ). Patient took 1/2 tab on 09/08/13         . pantoprazole (PROTONIX) 20 MG tablet   Oral   Take 1 tablet (20 mg total) by mouth daily.   30 tablet   0    BP 187/102  Pulse 79  Temp(Src) 98 F (36.7 C) (Oral)  Resp 16  SpO2 99%  Vital signs normal except hypertension  Physical Exam  Nursing note and vitals reviewed. Constitutional: She is oriented to person, place, and time. She appears well-developed and well-nourished.  Non-toxic appearance. She does not appear ill. No distress.  HENT:  Head: Normocephalic and atraumatic.  Right Ear: External ear normal.  Left Ear: External ear normal.  Nose: Nose normal. No mucosal edema or rhinorrhea.  Mouth/Throat: Oropharynx is clear and moist and mucous membranes are normal. No dental abscesses or uvula swelling.  Eyes: Conjunctivae and EOM are normal. Pupils are equal, round, and reactive to light.  Neck: Normal range of motion and full passive range of motion without pain. Neck supple.  Cardiovascular: Normal rate, regular rhythm and normal heart sounds.  Exam reveals no gallop and no friction rub.   No murmur heard. Pulmonary/Chest: Effort normal and breath sounds normal. No respiratory distress. She has no wheezes. She has no rhonchi. She has no rales. She exhibits no tenderness and no crepitus.  Abdominal: Soft. Normal appearance and bowel sounds are normal. She exhibits no distension. There is no tenderness. There is no rebound and no guarding.  Musculoskeletal: Normal range of motion. She exhibits no edema and no tenderness.  Patient has chronic swelling of both knees were she's had bilateral knee replacement. She has diffuse tenderness of her back without localization.  Neurological:  She is alert and oriented to person, place, and time. She has normal strength. No cranial nerve deficit.  Skin:  Skin is warm, dry and intact. No rash noted. No erythema. No pallor.  Psychiatric: She has a normal mood and affect. Her speech is normal and behavior is normal. Her mood appears not anxious.    ED Course  Procedures (including critical care time)  Medications  methocarbamol (ROBAXIN) tablet 750 mg (750 mg Oral Given 09/09/13 1611)   1700 have discussed patient's test results. Have ordered nursing staff to ambulate her in her room.   PT was able to get up and walk in her room by nursing staff. Pt was sitting in bedside chair and showed me how she could stand up. We discussed she needs to use her walker and cane more. She has an appt tomorrow with her PCP and advised to discuss physical therapy to get the strength in her legs improved and to not take her Coumadin until she had her INR rechecked again tomorrow. We also discussed getting a panic button in case she falls because patient lives alone.   Labs Review Results for orders placed during the hospital encounter of 09/09/13  CBC WITH DIFFERENTIAL      Result Value Range   WBC 5.8  4.0 - 10.5 K/uL   RBC 3.11 (*) 3.87 - 5.11 MIL/uL   Hemoglobin 9.6 (*) 12.0 - 15.0 g/dL   HCT 29.0 (*) 36.0 - 46.0 %   MCV 93.2  78.0 - 100.0 fL   MCH 30.9  26.0 - 34.0 pg   MCHC 33.1  30.0 - 36.0 g/dL   RDW 15.0  11.5 - 15.5 %   Platelets 141 (*) 150 - 400 K/uL   Neutrophils Relative % 77  43 - 77 %   Neutro Abs 4.5  1.7 - 7.7 K/uL   Lymphocytes Relative 16  12 - 46 %   Lymphs Abs 0.9  0.7 - 4.0 K/uL   Monocytes Relative 6  3 - 12 %   Monocytes Absolute 0.3  0.1 - 1.0 K/uL   Eosinophils Relative 1  0 - 5 %   Eosinophils Absolute 0.1  0.0 - 0.7 K/uL   Basophils Relative 0  0 - 1 %   Basophils Absolute 0.0  0.0 - 0.1 K/uL  COMPREHENSIVE METABOLIC PANEL      Result Value Range   Sodium 142  135 - 145 mEq/L   Potassium 4.8  3.5 - 5.1 mEq/L   Chloride 106  96 - 112 mEq/L   CO2 24  19 - 32 mEq/L   Glucose, Bld 108 (*) 70 - 99 mg/dL   BUN 52 (*) 6  - 23 mg/dL   Creatinine, Ser 2.08 (*) 0.50 - 1.10 mg/dL   Calcium 10.0  8.4 - 10.5 mg/dL   Total Protein 7.1  6.0 - 8.3 g/dL   Albumin 3.5  3.5 - 5.2 g/dL   AST 26  0 - 37 U/L   ALT 13  0 - 35 U/L   Alkaline Phosphatase 81  39 - 117 U/L   Total Bilirubin 0.3  0.3 - 1.2 mg/dL   GFR calc non Af Amer 22 (*) >90 mL/min   GFR calc Af Amer 26 (*) >90 mL/min  CK TOTAL AND CKMB      Result Value Range   Total CK 538 (*) 7 - 177 U/L   CK, MB 4.8 (*) 0.3 - 4.0 ng/mL   Relative  Index 0.9  0.0 - 2.5  URINALYSIS, ROUTINE W REFLEX MICROSCOPIC      Result Value Range   Color, Urine YELLOW  YELLOW   APPearance CLOUDY (*) CLEAR   Specific Gravity, Urine 1.014  1.005 - 1.030   pH 5.5  5.0 - 8.0   Glucose, UA NEGATIVE  NEGATIVE mg/dL   Hgb urine dipstick NEGATIVE  NEGATIVE   Bilirubin Urine NEGATIVE  NEGATIVE   Ketones, ur NEGATIVE  NEGATIVE mg/dL   Protein, ur NEGATIVE  NEGATIVE mg/dL   Urobilinogen, UA 0.2  0.0 - 1.0 mg/dL   Nitrite NEGATIVE  NEGATIVE   Leukocytes, UA NEGATIVE  NEGATIVE  APTT      Result Value Range   aPTT 41 (*) 24 - 37 seconds  PROTIME-INR      Result Value Range   Prothrombin Time 31.4 (*) 11.6 - 15.2 seconds   INR 3.17 (*) 0.00 - 1.49  TROPONIN I      Result Value Range   Troponin I <0.30  <0.30 ng/mL   Laboratory interpretation all normal except over therapeutic INR, stable renal insuffic, stable anemia   Imaging Review Ct Head Wo Contrast  09/09/2013   CLINICAL DATA:  Weakness, falls.  EXAM: CT HEAD WITHOUT CONTRAST  TECHNIQUE: Contiguous axial images were obtained from the base of the skull through the vertex without intravenous contrast.  COMPARISON:  None.  FINDINGS: Atherosclerotic and physiologic intracranial calcifications. There is no evidence of acute intracranial hemorrhage, brain edema, mass lesion, acute infarction, mass effect, or midline shift. Acute infarct may be inapparent on noncontrast CT. No other intra-axial abnormalities are seen, and the  ventricles and sulci are within normal limits in size and symmetry. No abnormal extra-axial fluid collections or masses are identified. No significant calvarial abnormality.  IMPRESSION: Negative for bleed or other acute intracranial process.   Electronically Signed   By: Arne Cleveland M.D.   On: 09/09/2013 16:55    EKG Interpretation    Date/Time:  Sunday September 09 2013 15:56:36 EST Ventricular Rate:  67 PR Interval:  172 QRS Duration: 76 QT Interval:  390 QTC Calculation: 412 R Axis:   55 Text Interpretation:  Electronic atrial pacemaker Atrial pacemaker is new since last tracing in August 2014 Confirmed by [facility]  MD-I, Ademola Vert ([id number]) on 09/09/2013 4:36:17 PM            MDM   1. Falls frequently   2. Weakness   3. Anemia   4. Chronic renal insufficiency   5. Warfarin-induced coagulopathy, initial encounter     Discharge medications  Robaxin   Plan discharge   Rolland Porter, MD, Alanson Aly, MD 09/09/13 1919

## 2013-09-09 NOTE — ED Notes (Addendum)
PER EMS- pt picked up from friend's home with c/o bilateral leg weakness x1 day.  Reports pt fell x2 yesterday.  Denies LOC or head injury.  After fall pt c/o l flank pain and bilateral leg weakness.  Pt has hx of bilateral knee replacements and hx of weakness in legs related to knee replacement.  Pt is ambulatory and walks with a walker at home. No bruising or deformities noted.  Pt reports she hasn't had any medications today.  Son en route with mediations.  Pt has hx of HTN. Pt alert and oriented.  Denies any other symptoms.  Pt reports taking warfarin.

## 2013-09-09 NOTE — ED Notes (Signed)
Bed: [id number] Expected date:  Expected time:  Means of arrival:  Comments: Room 5, Nausea and Headache

## 2013-10-15 ENCOUNTER — Observation Stay (HOSPITAL_COMMUNITY)
Admission: EM | Admit: 2013-10-15 | Discharge: 2013-10-17 | Disposition: A | Discharge status: Home / Self Care | Payer: PRIVATE HEALTH INSURANCE | Attending: Internal Medicine | Admitting: Internal Medicine

## 2013-10-15 ENCOUNTER — Encounter (HOSPITAL_COMMUNITY): Payer: Self-pay | Admitting: Emergency Medicine

## 2013-10-15 ENCOUNTER — Emergency Department (HOSPITAL_COMMUNITY): Payer: PRIVATE HEALTH INSURANCE

## 2013-10-15 DIAGNOSIS — R05 Cough: Secondary | ICD-10-CM | POA: Insufficient documentation

## 2013-10-15 DIAGNOSIS — R0789 Other chest pain: Principal | ICD-10-CM | POA: Insufficient documentation

## 2013-10-15 DIAGNOSIS — Z79899 Other long term (current) drug therapy: Secondary | ICD-10-CM | POA: Insufficient documentation

## 2013-10-15 DIAGNOSIS — M129 Arthropathy, unspecified: Secondary | ICD-10-CM | POA: Insufficient documentation

## 2013-10-15 DIAGNOSIS — N183 Chronic kidney disease, stage 3 unspecified: Secondary | ICD-10-CM

## 2013-10-15 DIAGNOSIS — I4891 Unspecified atrial fibrillation: Secondary | ICD-10-CM | POA: Diagnosis present

## 2013-10-15 DIAGNOSIS — Z950 Presence of cardiac pacemaker: Secondary | ICD-10-CM | POA: Diagnosis present

## 2013-10-15 DIAGNOSIS — R079 Chest pain, unspecified: Secondary | ICD-10-CM | POA: Diagnosis present

## 2013-10-15 DIAGNOSIS — Z888 Allergy status to other drugs, medicaments and biological substances status: Secondary | ICD-10-CM | POA: Insufficient documentation

## 2013-10-15 DIAGNOSIS — M109 Gout, unspecified: Secondary | ICD-10-CM | POA: Insufficient documentation

## 2013-10-15 DIAGNOSIS — M7989 Other specified soft tissue disorders: Secondary | ICD-10-CM | POA: Insufficient documentation

## 2013-10-15 DIAGNOSIS — I129 Hypertensive chronic kidney disease with stage 1 through stage 4 chronic kidney disease, or unspecified chronic kidney disease: Secondary | ICD-10-CM | POA: Insufficient documentation

## 2013-10-15 DIAGNOSIS — Z7901 Long term (current) use of anticoagulants: Secondary | ICD-10-CM | POA: Insufficient documentation

## 2013-10-15 DIAGNOSIS — I10 Essential (primary) hypertension: Secondary | ICD-10-CM | POA: Diagnosis present

## 2013-10-15 DIAGNOSIS — I2510 Atherosclerotic heart disease of native coronary artery without angina pectoris: Secondary | ICD-10-CM | POA: Diagnosis present

## 2013-10-15 DIAGNOSIS — Z7982 Long term (current) use of aspirin: Secondary | ICD-10-CM | POA: Insufficient documentation

## 2013-10-15 DIAGNOSIS — N189 Chronic kidney disease, unspecified: Secondary | ICD-10-CM

## 2013-10-15 DIAGNOSIS — N179 Acute kidney failure, unspecified: Secondary | ICD-10-CM | POA: Insufficient documentation

## 2013-10-15 DIAGNOSIS — R059 Cough, unspecified: Secondary | ICD-10-CM | POA: Insufficient documentation

## 2013-10-15 DIAGNOSIS — R1013 Epigastric pain: Secondary | ICD-10-CM | POA: Diagnosis present

## 2013-10-15 DIAGNOSIS — R748 Abnormal levels of other serum enzymes: Secondary | ICD-10-CM | POA: Diagnosis present

## 2013-10-15 DIAGNOSIS — R609 Edema, unspecified: Secondary | ICD-10-CM | POA: Insufficient documentation

## 2013-10-15 DIAGNOSIS — N289 Disorder of kidney and ureter, unspecified: Secondary | ICD-10-CM

## 2013-10-15 LAB — HEPATIC FUNCTION PANEL
ALBUMIN: 3.3 g/dL — AB (ref 3.5–5.2)
ALT: 38 U/L — ABNORMAL HIGH (ref 0–35)
AST: 149 U/L — ABNORMAL HIGH (ref 0–37)
Alkaline Phosphatase: 160 U/L — ABNORMAL HIGH (ref 39–117)
BILIRUBIN TOTAL: 0.7 mg/dL (ref 0.3–1.2)
Bilirubin, Direct: 0.3 mg/dL (ref 0.0–0.3)
Indirect Bilirubin: 0.4 mg/dL (ref 0.3–0.9)
Total Protein: 7 g/dL (ref 6.0–8.3)

## 2013-10-15 LAB — CBC WITH DIFFERENTIAL/PLATELET
Basophils Absolute: 0 10*3/uL (ref 0.0–0.1)
Basophils Relative: 0 % (ref 0–1)
EOS PCT: 1 % (ref 0–5)
Eosinophils Absolute: 0 10*3/uL (ref 0.0–0.7)
HEMATOCRIT: 29.1 % — AB (ref 36.0–46.0)
Hemoglobin: 9.7 g/dL — ABNORMAL LOW (ref 12.0–15.0)
LYMPHS PCT: 11 % — AB (ref 12–46)
Lymphs Abs: 0.7 10*3/uL (ref 0.7–4.0)
MCH: 31.4 pg (ref 26.0–34.0)
MCHC: 33.3 g/dL (ref 30.0–36.0)
MCV: 94.2 fL (ref 78.0–100.0)
MONO ABS: 0.5 10*3/uL (ref 0.1–1.0)
Monocytes Relative: 9 % (ref 3–12)
Neutro Abs: 4.6 10*3/uL (ref 1.7–7.7)
Neutrophils Relative %: 79 % — ABNORMAL HIGH (ref 43–77)
Platelets: 148 10*3/uL — ABNORMAL LOW (ref 150–400)
RBC: 3.09 MIL/uL — AB (ref 3.87–5.11)
RDW: 15.8 % — ABNORMAL HIGH (ref 11.5–15.5)
WBC: 5.8 10*3/uL (ref 4.0–10.5)

## 2013-10-15 LAB — POCT I-STAT, CHEM 8
BUN: 41 mg/dL — ABNORMAL HIGH (ref 6–23)
Calcium, Ion: 1.2 mmol/L (ref 1.13–1.30)
Chloride: 108 mEq/L (ref 96–112)
Creatinine, Ser: 1.6 mg/dL — ABNORMAL HIGH (ref 0.50–1.10)
GLUCOSE: 95 mg/dL (ref 70–99)
HEMATOCRIT: 29 % — AB (ref 36.0–46.0)
Hemoglobin: 9.9 g/dL — ABNORMAL LOW (ref 12.0–15.0)
POTASSIUM: 4.7 meq/L (ref 3.7–5.3)
SODIUM: 141 meq/L (ref 137–147)
TCO2: 26 mmol/L (ref 0–100)

## 2013-10-15 LAB — PROTIME-INR
INR: 2.29 — ABNORMAL HIGH (ref 0.00–1.49)
Prothrombin Time: 24.5 seconds — ABNORMAL HIGH (ref 11.6–15.2)

## 2013-10-15 LAB — POCT I-STAT TROPONIN I: Troponin i, poc: 0.01 ng/mL (ref 0.00–0.08)

## 2013-10-15 LAB — TROPONIN I: Troponin I: <0.3 ng/mL (ref <0.30)

## 2013-10-15 LAB — LIPASE, BLOOD: Lipase: 13 U/L (ref 11–59)

## 2013-10-15 MED ORDER — ASPIRIN EC 325 MG PO TBEC
325.0000 mg | DELAYED_RELEASE_TABLET | Freq: Every morning | ORAL | Status: DC
  Administered 2013-10-16 – 2013-10-17 (×2): 325 mg via ORAL
  Filled 2013-10-15 (×2): qty 1

## 2013-10-15 MED ORDER — AMIODARONE HCL 200 MG PO TABS
200.0000 mg | ORAL_TABLET | Freq: Every day | ORAL | Status: DC
  Administered 2013-10-16 – 2013-10-17 (×2): 200 mg via ORAL
  Filled 2013-10-15 (×2): qty 1

## 2013-10-15 MED ORDER — FEBUXOSTAT 40 MG PO TABS
40.0000 mg | ORAL_TABLET | Freq: Every morning | ORAL | Status: DC
  Administered 2013-10-16 – 2013-10-17 (×2): 40 mg via ORAL
  Filled 2013-10-15 (×2): qty 1

## 2013-10-15 MED ORDER — NITROGLYCERIN 0.4 MG SL SUBL
0.4000 mg | SUBLINGUAL_TABLET | SUBLINGUAL | Status: DC | PRN
  Administered 2013-10-15: 0.4 mg via SUBLINGUAL

## 2013-10-15 MED ORDER — ACETAMINOPHEN 325 MG PO TABS
650.0000 mg | ORAL_TABLET | ORAL | Status: DC | PRN
  Administered 2013-10-16 – 2013-10-17 (×2): 650 mg via ORAL
  Filled 2013-10-15 (×2): qty 2

## 2013-10-15 MED ORDER — MORPHINE SULFATE 4 MG/ML IJ SOLN
4.0000 mg | Freq: Once | INTRAMUSCULAR | Status: AC
  Administered 2013-10-15: 4 mg via INTRAVENOUS
  Filled 2013-10-15: qty 1

## 2013-10-15 MED ORDER — METHOCARBAMOL 500 MG PO TABS
500.0000 mg | ORAL_TABLET | Freq: Three times a day (TID) | ORAL | Status: DC | PRN
  Filled 2013-10-15: qty 1

## 2013-10-15 MED ORDER — NEBIVOLOL HCL 10 MG PO TABS
10.0000 mg | ORAL_TABLET | Freq: Every morning | ORAL | Status: DC
  Administered 2013-10-15 – 2013-10-17 (×2): 10 mg via ORAL
  Filled 2013-10-15 (×2): qty 1

## 2013-10-15 MED ORDER — ALPRAZOLAM 0.25 MG PO TABS: 0.2500 mg | ORAL_TABLET | Freq: Two times a day (BID) | ORAL | Status: DC | PRN

## 2013-10-15 MED ORDER — MORPHINE SULFATE 2 MG/ML IJ SOLN
1.0000 mg | INTRAMUSCULAR | Status: DC | PRN
  Administered 2013-10-15: 2 mg via INTRAVENOUS
  Filled 2013-10-15: qty 1

## 2013-10-15 MED ORDER — PRAVASTATIN SODIUM 10 MG PO TABS
10.0000 mg | ORAL_TABLET | Freq: Every day | ORAL | Status: DC
  Filled 2013-10-15: qty 1

## 2013-10-15 MED ORDER — TORSEMIDE 10 MG PO TABS
10.0000 mg | ORAL_TABLET | Freq: Every day | ORAL | Status: DC
  Administered 2013-10-16 – 2013-10-17 (×2): 10 mg via ORAL
  Filled 2013-10-15 (×2): qty 1

## 2013-10-15 MED ORDER — HYDRALAZINE HCL 20 MG/ML IJ SOLN: 10.0000 mg | INTRAMUSCULAR | Status: DC | PRN

## 2013-10-15 MED ORDER — ONDANSETRON HCL 4 MG/2ML IJ SOLN
4.0000 mg | Freq: Four times a day (QID) | INTRAMUSCULAR | Status: DC | PRN
Start: 2013-10-15 — End: 2013-10-17

## 2013-10-15 MED ORDER — GI COCKTAIL ~~LOC~~
30.0000 mL | Freq: Once | ORAL | Status: AC
  Administered 2013-10-15: 30 mL via ORAL
  Filled 2013-10-15: qty 30

## 2013-10-15 MED ORDER — HYDRALAZINE HCL 50 MG PO TABS
50.0000 mg | ORAL_TABLET | Freq: Three times a day (TID) | ORAL | Status: DC
  Administered 2013-10-15 – 2013-10-17 (×5): 50 mg via ORAL
  Filled 2013-10-15 (×7): qty 1

## 2013-10-15 MED ORDER — PANTOPRAZOLE SODIUM 40 MG IV SOLR
40.0000 mg | INTRAVENOUS | Status: DC
  Administered 2013-10-15 – 2013-10-16 (×2): 40 mg via INTRAVENOUS
  Filled 2013-10-15 (×3): qty 40

## 2013-10-15 MED ORDER — IOHEXOL 350 MG/ML SOLN
80.0000 mL | Freq: Once | INTRAVENOUS | Status: AC | PRN
  Administered 2013-10-15: 80 mL via INTRAVENOUS

## 2013-10-15 MED ORDER — SIMVASTATIN 40 MG PO TABS: 40.0000 mg | ORAL_TABLET | Freq: Every day | ORAL | Status: DC

## 2013-10-15 NOTE — ED Provider Notes (Signed)
Care assumed from Dr. Alvino Chapel.  78 yo female with hx of cardiac pacemaker for arrythmia presenting with chest pain.  Her med hx states she has CAD, but she denies this herself.  Her chest pain woke her from sleep at 0600 and persisted throughout the day.  Better now.  At one point ,seemed to move to epigastrium, but then back to chest.  Associated with some SOB and nausea, no diaphoresis or dizziness.  CTA obtained by Dr. Alvino Chapel, which showed no dissection or aneurysm, but did show coronary plaque.  As such, will ask hospitalist to admit for obs and further ACS rule out.    Clinical Impression: Chest pain   Denise Siren, MD 10/15/13 2315

## 2013-10-15 NOTE — H&P (Signed)
[facility] History and Physical  Patient: [name]  SWH:[id number]  DOB: 20-Feb-1936  DOS: the patient was seen and examined on 10/15/2013 PCP: Windell Hummingbird, PA-C  Chief Complaint: Chest pain  HPI: [name] is a 78 y.o. female with Past medical history of Hypertension, atrial fibrillation on Coumadin, pacemaker, renal stones. The patient is coming from home. The patient presented with complaints of chest pain that woke her up this morning. The pain was located substernally and that was radiating to her back. When she came to the ED the pain radiated to her abdomen. She mentions the pain has been on and off but has never resolved completely. She was given a dose of nitroglycerin which she mentions has improved her pain a little. She denies any similar pain on exertion. She denies any dyspnea on exertion but denies any nausea vomiting diaphoresis. No recent fever or cough or chills.  Review of Systems: as mentioned in the history of present illness.  A Comprehensive review of the other systems is negative.  Past Medical History  Diagnosis Date  . Arthritis   . Renal disorder     kidney stones  . Renal insufficiency   . Bradycardia   . Hypertension   . Gout   . Coronary artery disease   . Pacemaker   . Leg weakness, bilateral    Past Surgical History  Procedure Laterality Date  . Abdominal hysterectomy    . Joint replacement      knee replacement  . Arm surgery    . Tummy tuck    . Shoulder surgery    . Pacemaker insertion    . Replacement total knee bilateral     Social History:  reports that she has never smoked. She does not have any smokeless tobacco history on file. She reports that she drinks alcohol. She reports that she does not use illicit drugs. Independent for most of her  ADL.  Allergies  Allergen Reactions  . Toprol Xl [Metoprolol Succinate] Rash and Other (See Comments)    Reaction: hair loss    History reviewed. No pertinent family  history.  Prior to Admission medications   Medication Sig Start Date End Date Taking? Authorizing Provider  amiodarone (PACERONE) 200 MG tablet Take 200 mg by mouth daily.   Yes Historical Provider, MD  aspirin EC 81 MG tablet Take 81 mg by mouth every morning.   Yes Historical Provider, MD  cholecalciferol (VITAMIN D) 1000 UNITS tablet Take 2,000 Units by mouth every morning.    Yes Historical Provider, MD  febuxostat (ULORIC) 40 MG tablet Take 40 mg by mouth every morning.    Yes Historical Provider, MD  fish oil-omega-3 fatty acids 1000 MG capsule Take 2 g by mouth every morning.    Yes Historical Provider, MD  folic acid (FOLVITE) 1 MG tablet Take 1 mg by mouth every morning.    Yes Historical Provider, MD  hydrALAZINE (APRESOLINE) 50 MG tablet Take 50 mg by mouth 3 (three) times daily.   Yes Historical Provider, MD  methocarbamol (ROBAXIN) 500 MG tablet Take 1 tablet (500 mg total) by mouth every 8 (eight) hours as needed (muscle soreness). 09/09/13  Yes Janice Norrie, MD  nebivolol (BYSTOLIC) 10 MG tablet Take 10 mg by mouth every morning.    Yes Historical Provider, MD  omeprazole (PRILOSEC) 40 MG capsule Take 40 mg by mouth every morning.   Yes Historical Provider, MD  pravastatin (PRAVACHOL) 10 MG tablet Take 10  mg by mouth at bedtime.   Yes Historical Provider, MD  telmisartan (MICARDIS) 40 MG tablet Take 40 mg by mouth every morning.   Yes Historical Provider, MD  torsemide (DEMADEX) 10 MG tablet Take 10 mg by mouth daily.   Yes Historical Provider, MD  warfarin (COUMADIN) 2.5 MG tablet Take 1.25-2.5 mg by mouth daily. Take 2.28m (whole tablet) every other day while alternating with 1.275m(1/2 tablets)   Yes Historical Provider, MD    Physical Exam: Filed Vitals:   10/15/13 1857 10/15/13 1900 10/15/13 1915 10/15/13 2101  BP: 189/81 163/86 171/86 186/74  Pulse: 72 75 69 73  Temp:    98.3 F (36.8 C)  TempSrc:    Oral  Resp: 20   14  Height:      Weight:      SpO2: 97% 97% 97%  96%    General: Alert, Awake and Oriented to Time, Place and Person. Appear in mild distress Eyes: PERRL ENT: Oral Mucosa clear moist. Neck: no JVD Cardiovascular: S1 and S2 Present, no Murmur, Peripheral Pulses Present Respiratory: Bilateral Air entry equal and Decreased, Clear to Auscultation,  no Crackles,no wheezes Abdomen: Bowel Sound Present, Soft and Non tender Skin: no Rash Extremities: no Pedal edema, no calf tenderness Neurologic: Grossly Unremarkable. Labs on Admission:  CBC:  Recent Labs Lab 10/15/13 1155 10/15/13 1322  WBC 5.8  --   NEUTROABS 4.6  --   HGB 9.7* 9.9*  HCT 29.1* 29.0*  MCV 94.2  --   PLT 148*  --     CMP     Component Value Date/Time   NA 141 10/15/2013 1322   K 4.7 10/15/2013 1322   CL 108 10/15/2013 1322   CO2 24 09/09/2013 1540   GLUCOSE 95 10/15/2013 1322   BUN 41* 10/15/2013 1322   CREATININE 1.60* 10/15/2013 1322   CALCIUM 10.0 09/09/2013 1540   PROT 7.0 10/15/2013 1614   ALBUMIN 3.3* 10/15/2013 1614   AST 149* 10/15/2013 1614   ALT 38* 10/15/2013 1614   ALKPHOS 160* 10/15/2013 1614   BILITOT 0.7 10/15/2013 1614   GFRNONAA 22* 09/09/2013 1540   GFRAA 26* 09/09/2013 1540     Recent Labs Lab 10/15/13 1614  LIPASE 13   No results found for this basename: AMMONIA,  in the last 168 hours   Recent Labs Lab 10/15/13 2045  TROPONINI <0.30   BNP (last 3 results)  Recent Labs  04/25/13 1220  PROBNP 1312.0*    Radiological Exams on Admission: Dg Chest 2 View  10/15/2013   CLINICAL DATA:  Hypertension and chest pain radiating to the back.  EXAM: CHEST  2 VIEW  COMPARISON:  DG CHEST 1V PORT dated 04/25/2013  FINDINGS: The lungs are mildly hyperinflated with hemidiaphragm flattening. There is blunting of the posterior and lateral costophrenic angles on the left which is not entirely new. The cardiac silhouette remains enlarged. The central pulmonary vascularity remains prominent. A permanent pacemaker is unchanged in appearance. There is  tortuosity of the descending thoracic aorta. The observed portions of the bony thorax appear normal.  IMPRESSION: The findings are consistent with low grade CHF little changed from the previous study. There is no evidence of pneumonia.   Electronically Signed   By: David  JoMartinique On: 10/15/2013 14:24   Ct Angio Chest Aorta W/cm &/or Wo/cm  10/15/2013   CLINICAL DATA:  Chest pain radiating into back.  EXAM: CT ANGIOGRAPHY CHEST, ABDOMEN AND PELVIS  TECHNIQUE: Multidetector CT imaging  through the chest, abdomen and pelvis was performed using the standard protocol during bolus administration of intravenous contrast. Multiplanar reconstructed images and MIPs were obtained and reviewed to evaluate the vascular anatomy.  CONTRAST:  16m OMNIPAQUE IOHEXOL 350 MG/ML SOLN  COMPARISON:  None.  FINDINGS: CTA CHEST FINDINGS  The thoracic aorta is of normal caliber and demonstrates no evidence of dissection. Proximal great vessels show normal patency and branching pattern.  The heart is mildly enlarged. Underlying pacemaker is identified. Focally calcified plaque is identified in the distribution of the distal LAD. No pleural or pericardial fluid is identified.  Central pulmonary arteries are prominent in caliber. There is no evidence of pulmonary embolism. Lungs show scattered areas of scarring bilaterally without evidence of edema, infiltrate or nodule. No enlarged lymph nodes are identified.  Review of the MIP images confirms the above findings.  CTA ABDOMEN AND PELVIS FINDINGS  The abdominal aorta is of normal caliber and shows no evidence of aneurysm or dissection. Iliac and common femoral arteries are normally patent bilaterally. Visceral branch vessels show no significant stenoses. The right renal artery demonstrates early bifurcation. The left kidney is supplied by 2 separate arteries emanating off of the abdominal aorta.  Solid organs of the abdomen show no significant abnormalities. There is cortical thinning of  the left kidney. No abnormal fluid collections are identified. The gallbladder has been removed. No masses or enlarged lymph nodes are seen. No hernias are identified. The bladder is unremarkable. The uterus has been removed. Degenerative changes are present in the lumbar spine.  Review of the MIP images confirms the above findings.  IMPRESSION: No evidence of aneurysmal disease or dissection in the chest, abdomen or pelvis. Focal calcified plaque is identified in the distribution of the distal LAD. Prominence of central pulmonary arteries may reflect pulmonary hypertension.   Electronically Signed   By: GAletta EdouardM.D.   On: 10/15/2013 19:03   Ct Cta Abd/pel W/cm &/or W/o Cm  10/15/2013   CLINICAL DATA:  Chest pain radiating into back.  EXAM: CT ANGIOGRAPHY CHEST, ABDOMEN AND PELVIS  TECHNIQUE: Multidetector CT imaging through the chest, abdomen and pelvis was performed using the standard protocol during bolus administration of intravenous contrast. Multiplanar reconstructed images and MIPs were obtained and reviewed to evaluate the vascular anatomy.  CONTRAST:  869mOMNIPAQUE IOHEXOL 350 MG/ML SOLN  COMPARISON:  None.  FINDINGS: CTA CHEST FINDINGS  The thoracic aorta is of normal caliber and demonstrates no evidence of dissection. Proximal great vessels show normal patency and branching pattern.  The heart is mildly enlarged. Underlying pacemaker is identified. Focally calcified plaque is identified in the distribution of the distal LAD. No pleural or pericardial fluid is identified.  Central pulmonary arteries are prominent in caliber. There is no evidence of pulmonary embolism. Lungs show scattered areas of scarring bilaterally without evidence of edema, infiltrate or nodule. No enlarged lymph nodes are identified.  Review of the MIP images confirms the above findings.  CTA ABDOMEN AND PELVIS FINDINGS  The abdominal aorta is of normal caliber and shows no evidence of aneurysm or dissection. Iliac and  common femoral arteries are normally patent bilaterally. Visceral branch vessels show no significant stenoses. The right renal artery demonstrates early bifurcation. The left kidney is supplied by 2 separate arteries emanating off of the abdominal aorta.  Solid organs of the abdomen show no significant abnormalities. There is cortical thinning of the left kidney. No abnormal fluid collections are identified. The gallbladder has been removed. No  masses or enlarged lymph nodes are seen. No hernias are identified. The bladder is unremarkable. The uterus has been removed. Degenerative changes are present in the lumbar spine.  Review of the MIP images confirms the above findings.  IMPRESSION: No evidence of aneurysmal disease or dissection in the chest, abdomen or pelvis. Focal calcified plaque is identified in the distribution of the distal LAD. Prominence of central pulmonary arteries may reflect pulmonary hypertension.   Electronically Signed   By: Aletta Edouard M.D.   On: 10/15/2013 19:03    EKG: Independently reviewed. normal EKG, normal sinus rhythm, nonspecific ST and T waves changes.  Assessment/Plan Principal Problem:   Chest pain Active Problems:   CAD (coronary artery disease)   Hypertension   Pacemaker   Atrial fibrillation   1. Chest pain The patient is presenting with complaints of chest pain. Her pain appears atypical in nature. HER-2 sets of troponins are negative her to repeat EKGs are also not showing any acute ST-T wave changes which could explain acute ischemia. She has undergone a CT MG at bedtime to which is negative for any pulmonary embolism. She had undergone a stress test 3 years ago which was also negative. At present I will keep her n.p.o. For possible stress test in morning. I would continue repeating her to telemetry and troponins. I will obtain an echocardiogram in the morning. I would put her on GI cocktail and Protonix for further pain control for possible GI  causes.  2.Hypertension Continue her home antihypertensive medications and adding hydralazine when necessary  3.History of pacemaker and atrial fibrillation Continue Coumadin  DVT Prophylaxis mechanical compression device Nutrition: Cardiac diet but n.p.o.  Code Status: Full  Disposition: Admitted to observation in telemetry unit.  Author: Berle Mull, MD [facility] Pager: [phone] 10/15/2013, 9:34 PM    If 7PM-7AM, please contact night-coverage [email or web address] Password [email or web address]

## 2013-10-15 NOTE — ED Notes (Signed)
DR. Posey Pronto ( ADMITTING MD ) AT BEDSIDE EVALUATING PT.

## 2013-10-15 NOTE — ED Provider Notes (Signed)
CSN: [id number]     Arrival date & time 10/15/13  1113 History   First MD Initiated Contact with Patient 10/15/13 1230     Chief Complaint  Patient presents with  . Chest Pain   (Consider location/radiation/quality/duration/timing/severity/associated sxs/prior Treatment) Patient is a 78 y.o. female presenting with chest pain. The history is provided by the patient.  Chest Pain Associated symptoms: cough   Associated symptoms: no abdominal pain, no back pain, no headache, no nausea, no numbness, no shortness of breath, not vomiting and no weakness    patient presents with retrosternal chest pain. Worse with movement, sitting up, or palpation. It woke her at 6 this morning. No nausea vomiting. Mild shortness of breath. No fevers. No cough. No numbness or weakness. She states she feels as if she has a cough, but nothing will come up. She has some chronic swelling in both legs.  Past Medical History  Diagnosis Date  . Arthritis   . Renal disorder     kidney stones  . Renal insufficiency   . Bradycardia   . Hypertension   . Gout   . Coronary artery disease   . Pacemaker   . Leg weakness, bilateral    Past Surgical History  Procedure Laterality Date  . Abdominal hysterectomy    . Joint replacement      knee replacement  . Arm surgery    . Tummy tuck    . Shoulder surgery    . Pacemaker insertion    . Replacement total knee bilateral     History reviewed. No pertinent family history. History  Substance Use Topics  . Smoking status: Never Smoker   . Smokeless tobacco: Not on file  . Alcohol Use: Yes     Comment: occasional   OB History   Grav Para Term Preterm Abortions TAB SAB Ect Mult Living                 Review of Systems  Constitutional: Negative for activity change and appetite change.  Eyes: Negative for pain.  Respiratory: Positive for cough. Negative for chest tightness and shortness of breath.   Cardiovascular: Positive for chest pain and leg swelling.   Gastrointestinal: Negative for nausea, vomiting, abdominal pain and diarrhea.  Genitourinary: Negative for flank pain.  Musculoskeletal: Negative for back pain and neck stiffness.  Skin: Negative for rash.  Neurological: Negative for weakness, numbness and headaches.  Psychiatric/Behavioral: Negative for behavioral problems.    Allergies  Toprol xl  Home Medications   Current Outpatient Rx  Name  Route  Sig  Dispense  Refill  . amiodarone (PACERONE) 200 MG tablet   Oral   Take 200 mg by mouth daily.         Marland Kitchen aspirin EC 81 MG tablet   Oral   Take 81 mg by mouth every morning.         . cholecalciferol (VITAMIN D) 1000 UNITS tablet   Oral   Take 2,000 Units by mouth every morning.          . febuxostat (ULORIC) 40 MG tablet   Oral   Take 40 mg by mouth every morning.          . fish oil-omega-3 fatty acids 1000 MG capsule   Oral   Take 2 g by mouth every morning.          . folic acid (FOLVITE) 1 MG tablet   Oral   Take 1 mg by mouth every morning.          Marland Kitchen  hydrALAZINE (APRESOLINE) 50 MG tablet   Oral   Take 50 mg by mouth 3 (three) times daily.         . methocarbamol (ROBAXIN) 500 MG tablet   Oral   Take 1 tablet (500 mg total) by mouth every 8 (eight) hours as needed (muscle soreness).   40 tablet   0   . nebivolol (BYSTOLIC) 10 MG tablet   Oral   Take 10 mg by mouth every morning.          Marland Kitchen omeprazole (PRILOSEC) 40 MG capsule   Oral   Take 40 mg by mouth every morning.         . pravastatin (PRAVACHOL) 10 MG tablet   Oral   Take 10 mg by mouth at bedtime.         Marland Kitchen telmisartan (MICARDIS) 40 MG tablet   Oral   Take 40 mg by mouth every morning.         . torsemide (DEMADEX) 10 MG tablet   Oral   Take 10 mg by mouth daily.         Marland Kitchen warfarin (COUMADIN) 2.5 MG tablet   Oral   Take 1.25-2.5 mg by mouth daily. Take 2.5mg  (whole tablet) every other day while alternating with 1.25mg  (1/2 tablets)          BP 179/75   Pulse 78  Temp(Src) 98.1 F (36.7 C) (Oral)  Resp 20  Ht 5\' 5"  (1.651 m)  Wt 228 lb (103.42 kg)  BMI 37.94 kg/m2  SpO2 99% Physical Exam  Nursing note and vitals reviewed. Constitutional: She is oriented to person, place, and time. She appears well-developed and well-nourished.  HENT:  Head: Normocephalic and atraumatic.  Eyes: EOM are normal. Pupils are equal, round, and reactive to light.  Neck: Normal range of motion. Neck supple.  Cardiovascular: Normal rate, regular rhythm and normal heart sounds.   No murmur heard. Pulmonary/Chest: Effort normal and breath sounds normal. No respiratory distress. She has no wheezes. She has no rales. She exhibits tenderness.  Lower sternal/anterior chest tenderness. No crepitance or deformity. No rash. No abdominal tenderness.  Abdominal: Soft. Bowel sounds are normal. She exhibits no distension. There is no tenderness. There is no rebound and no guarding.  Musculoskeletal: Normal range of motion. She exhibits edema.  Peripheral edema to bilateral lower legs.  Neurological: She is alert and oriented to person, place, and time. No cranial nerve deficit.  Skin: Skin is warm and dry.  Psychiatric: She has a normal mood and affect. Her speech is normal.    ED Course  Procedures (including critical care time) Labs Review Labs Reviewed  CBC WITH DIFFERENTIAL - Abnormal; Notable for the following:    RBC 3.09 (*)    Hemoglobin 9.7 (*)    HCT 29.1 (*)    RDW 15.8 (*)    Platelets 148 (*)    Neutrophils Relative % 79 (*)    Lymphocytes Relative 11 (*)    All other components within normal limits  PROTIME-INR - Abnormal; Notable for the following:    Prothrombin Time 24.5 (*)    INR 2.29 (*)    All other components within normal limits  POCT I-STAT, CHEM 8 - Abnormal; Notable for the following:    BUN 41 (*)    Creatinine, Ser 1.60 (*)    Hemoglobin 9.9 (*)    HCT 29.0 (*)    All other components within normal limits  HEPATIC FUNCTION  PANEL  LIPASE, BLOOD  POCT I-STAT TROPONIN I   Imaging Review Dg Chest 2 View  10/15/2013   CLINICAL DATA:  Hypertension and chest pain radiating to the back.  EXAM: CHEST  2 VIEW  COMPARISON:  DG CHEST 1V PORT dated 04/25/2013  FINDINGS: The lungs are mildly hyperinflated with hemidiaphragm flattening. There is blunting of the posterior and lateral costophrenic angles on the left which is not entirely new. The cardiac silhouette remains enlarged. The central pulmonary vascularity remains prominent. A permanent pacemaker is unchanged in appearance. There is tortuosity of the descending thoracic aorta. The observed portions of the bony thorax appear normal.  IMPRESSION: The findings are consistent with low grade CHF little changed from the previous study. There is no evidence of pneumonia.   Electronically Signed   By: David  Martinique   On: 10/15/2013 14:24    EKG Interpretation    Date/Time:  Monday October 15 2013 11:24:54 EST Ventricular Rate:  78 PR Interval:  167 QRS Duration: 95 QT Interval:  411 QTC Calculation: 468 R Axis:   53 Text Interpretation:  Sinus rhythm Baseline wander in lead(s) V6 Confirmed by Anthoni Geerts  MD, Sharen Youngren ([id number]) on 10/15/2013 1:36:46 PM            MDM  No diagnosis found. Patient with chest pain. Radiated to her back. After initial treatment patient states it was more in her abdomen and she agreed her upper abdominal tenderness. She states that went to the back at that area also. Patient's husband states she cannot go home because it is getting dark out. CT angiography will be done due 2 anticoagulation.    Jasper Riling. Alvino Chapel, MD 10/15/13 [phone]

## 2013-10-15 NOTE — ED Notes (Signed)
Patient arrived via [facility] from home with Chest Pain that woke her up apprx 0600 this morning. Patient stated chest pain was center chest that radiated to back. EMS states she showed on the monitor to be A/V pacing. Patient took Asprin 324 mg and EMS administered NTG SL x1 with slight improvement. Pacemaker device is [employer].

## 2013-10-16 ENCOUNTER — Encounter (HOSPITAL_COMMUNITY)
Admission: EM | Disposition: A | Discharge status: Home / Self Care | Payer: Self-pay | Source: Home / Self Care | Attending: Emergency Medicine

## 2013-10-16 ENCOUNTER — Observation Stay (HOSPITAL_COMMUNITY): Payer: PRIVATE HEALTH INSURANCE

## 2013-10-16 DIAGNOSIS — N189 Chronic kidney disease, unspecified: Secondary | ICD-10-CM

## 2013-10-16 DIAGNOSIS — R079 Chest pain, unspecified: Secondary | ICD-10-CM

## 2013-10-16 DIAGNOSIS — R748 Abnormal levels of other serum enzymes: Secondary | ICD-10-CM | POA: Diagnosis present

## 2013-10-16 DIAGNOSIS — I10 Essential (primary) hypertension: Secondary | ICD-10-CM

## 2013-10-16 DIAGNOSIS — N289 Disorder of kidney and ureter, unspecified: Secondary | ICD-10-CM

## 2013-10-16 DIAGNOSIS — R1013 Epigastric pain: Secondary | ICD-10-CM | POA: Diagnosis present

## 2013-10-16 LAB — COMPREHENSIVE METABOLIC PANEL
ALK PHOS: 299 U/L — AB (ref 39–117)
ALT: 194 U/L — ABNORMAL HIGH (ref 0–35)
AST: 536 U/L — ABNORMAL HIGH (ref 0–37)
Albumin: 2.9 g/dL — ABNORMAL LOW (ref 3.5–5.2)
BUN: 28 mg/dL — ABNORMAL HIGH (ref 6–23)
CO2: 25 mEq/L (ref 19–32)
Calcium: 9 mg/dL (ref 8.4–10.5)
Chloride: 105 mEq/L (ref 96–112)
Creatinine, Ser: 1.55 mg/dL — ABNORMAL HIGH (ref 0.50–1.10)
GFR calc non Af Amer: 32 mL/min — ABNORMAL LOW (ref >90)
GFR, EST AFRICAN AMERICAN: 37 mL/min — AB (ref >90)
GLUCOSE: 102 mg/dL — AB (ref 70–99)
Potassium: 4.4 mEq/L (ref 3.7–5.3)
SODIUM: 141 meq/L (ref 137–147)
Total Bilirubin: 0.5 mg/dL (ref 0.3–1.2)
Total Protein: 6.2 g/dL (ref 6.0–8.3)

## 2013-10-16 LAB — PROTIME-INR
INR: 2.87 — ABNORMAL HIGH (ref 0.00–1.49)
Prothrombin Time: 29.1 seconds — ABNORMAL HIGH (ref 11.6–15.2)

## 2013-10-16 LAB — CBC WITH DIFFERENTIAL/PLATELET
BASOS ABS: 0 10*3/uL (ref 0.0–0.1)
Basophils Relative: 0 % (ref 0–1)
EOS ABS: 0.1 10*3/uL (ref 0.0–0.7)
Eosinophils Relative: 2 % (ref 0–5)
HCT: 25 % — ABNORMAL LOW (ref 36.0–46.0)
Hemoglobin: 8.4 g/dL — ABNORMAL LOW (ref 12.0–15.0)
LYMPHS ABS: 0.6 10*3/uL — AB (ref 0.7–4.0)
LYMPHS PCT: 16 % (ref 12–46)
MCH: 31.7 pg (ref 26.0–34.0)
MCHC: 33.6 g/dL (ref 30.0–36.0)
MCV: 94.3 fL (ref 78.0–100.0)
Monocytes Absolute: 0.5 10*3/uL (ref 0.1–1.0)
Monocytes Relative: 14 % — ABNORMAL HIGH (ref 3–12)
NEUTROS PCT: 68 % (ref 43–77)
Neutro Abs: 2.6 10*3/uL (ref 1.7–7.7)
Platelets: 135 10*3/uL — ABNORMAL LOW (ref 150–400)
RBC: 2.65 MIL/uL — ABNORMAL LOW (ref 3.87–5.11)
RDW: 16 % — ABNORMAL HIGH (ref 11.5–15.5)
WBC: 3.8 10*3/uL — AB (ref 4.0–10.5)

## 2013-10-16 LAB — FERRITIN: Ferritin: 4507 ng/mL — ABNORMAL HIGH (ref 10–291)

## 2013-10-16 LAB — IRON AND TIBC
IRON: 48 ug/dL (ref 42–135)
Saturation Ratios: 24 % (ref 20–55)
TIBC: 204 ug/dL — AB (ref 250–470)
UIBC: 156 ug/dL (ref 125–400)

## 2013-10-16 LAB — RETICULOCYTES
RBC.: 2.88 MIL/uL — AB (ref 3.87–5.11)
Retic Count, Absolute: 51.8 10*3/uL (ref 19.0–186.0)
Retic Ct Pct: 1.8 % (ref 0.4–3.1)

## 2013-10-16 LAB — FOLATE: Folate: >20 ng/mL

## 2013-10-16 LAB — VITAMIN B12: Vitamin B-12: 706 pg/mL (ref 211–911)

## 2013-10-16 LAB — TROPONIN I: Troponin I: <0.3 ng/mL (ref <0.30)

## 2013-10-16 SURGERY — LEFT HEART CATHETERIZATION WITH CORONARY ANGIOGRAM
Anesthesia: LOCAL

## 2013-10-16 MED ORDER — REGADENOSON 0.4 MG/5ML IV SOLN
INTRAVENOUS | Status: AC
  Administered 2013-10-16: 0.4 mg via INTRAVENOUS
  Filled 2013-10-16: qty 5

## 2013-10-16 MED ORDER — ISOSORBIDE MONONITRATE ER 30 MG PO TB24
30.0000 mg | ORAL_TABLET | Freq: Every day | ORAL | Status: DC
  Administered 2013-10-16 – 2013-10-17 (×2): 30 mg via ORAL
  Filled 2013-10-16 (×2): qty 1

## 2013-10-16 MED ORDER — NITROGLYCERIN 0.2 MG/HR TD PT24
0.2000 mg | MEDICATED_PATCH | Freq: Every day | TRANSDERMAL | Status: DC
  Administered 2013-10-16 – 2013-10-17 (×2): 0.2 mg via TRANSDERMAL
  Filled 2013-10-16 (×2): qty 1

## 2013-10-16 MED ORDER — WARFARIN - PHARMACIST DOSING INPATIENT: Freq: Every day | Status: DC

## 2013-10-16 MED ORDER — TECHNETIUM TC 99M SESTAMIBI GENERIC - CARDIOLITE
10.0000 | Freq: Once | INTRAVENOUS | Status: AC | PRN
  Administered 2013-10-16: 10 via INTRAVENOUS

## 2013-10-16 MED ORDER — WARFARIN 1.25 MG HALF TABLET
1.2500 mg | ORAL_TABLET | Freq: Once | ORAL | Status: AC
  Administered 2013-10-16: 1.25 mg via ORAL
  Filled 2013-10-16: qty 1

## 2013-10-16 MED ORDER — TECHNETIUM TC 99M SESTAMIBI GENERIC - CARDIOLITE
30.0000 | Freq: Once | INTRAVENOUS | Status: AC | PRN
  Administered 2013-10-16: 30 via INTRAVENOUS

## 2013-10-16 MED ORDER — REGADENOSON 0.4 MG/5ML IV SOLN
0.4000 mg | Freq: Once | INTRAVENOUS | Status: AC
  Administered 2013-10-16: 0.4 mg via INTRAVENOUS

## 2013-10-16 NOTE — Progress Notes (Signed)
UR completed 

## 2013-10-16 NOTE — Progress Notes (Signed)
ANTICOAGULATION CONSULT NOTE - Follow Up Consult  Pharmacy Consult for coumadin Indication: atrial fibrillation  Allergies  Allergen Reactions  . Toprol Xl [Metoprolol Succinate] Rash and Other (See Comments)    Reaction: hair loss    Patient Measurements: Height: 5\' 5"  (165.1 cm) Weight: 229 lb 11.2 oz (104.191 kg) IBW/kg (Calculated) : 57   Vital Signs: Temp: 98.6 F (37 C) (01/27 0432) Temp src: Oral (01/27 0432) BP: 150/81 mmHg (01/27 0432) Pulse Rate: 68 (01/27 0432)  Labs:  Recent Labs  10/15/13 1155 10/15/13 1322 10/15/13 2045 10/16/13 0520 10/16/13 0600  HGB 9.7* 9.9*  --  8.4*  --   HCT 29.1* 29.0*  --  25.0*  --   PLT 148*  --   --  135*  --   LABPROT 24.5*  --   --   --  29.1*  INR 2.29*  --   --   --  2.87*  CREATININE  --  1.60*  --  1.55*  --   TROPONINI  --   --  <0.30 <0.30  --     Estimated Creatinine Clearance: 37.6 ml/min (by C-G formula based on Cr of 1.55).   Assessment: Patient is a 78 y.o F on coumadin PTA for afib.  Patient stated that home dose is 2.5mg  alternating with 1.25mg  with last dose of 2.5mg  taken on 1/25.  Patient reported that she didn't take her dose on 1/26 because she was in the ED.  INR is therapeutic but increased from 2.29 to at 2.87 today with dose missed yesterday.  No bleeding documented.  Goal of Therapy:  INR 2-3    Plan:  1) coumadin 1.25mg  PO x1 today 2) Consider reducing aspirin dose to 81mg  if appropriate  Thamara Leger P 10/16/2013,9:02 AM

## 2013-10-16 NOTE — Progress Notes (Signed)
Prelim. Lexiscan Myoview note  The reporting program for the nuclear studies is currently down.  I have reviewed the myoview.  NO ECG changes No ischemia Normal LV function with EF of 66%.  Full report tomorrow ( or when Fluency server is back up)   Thayer Headings, Brooke Bonito., MD, [facility] 10/16/2013, 6:28 PM Office - [phone] Pager [phone]

## 2013-10-16 NOTE — Progress Notes (Signed)
ANTICOAGULATION CONSULT NOTE - Initial Consult  Pharmacy Consult for Coumadin Indication: atrial fibrillation  Allergies  Allergen Reactions  . Toprol Xl [Metoprolol Succinate] Rash and Other (See Comments)    Reaction: hair loss    Patient Measurements: Height: 5\' 5"  (165.1 cm) Weight: 229 lb 11.2 oz (104.191 kg) IBW/kg (Calculated) : 57  Vital Signs: Temp: 98.6 F (37 C) (01/27 0432) Temp src: Oral (01/27 0432) BP: 150/81 mmHg (01/27 0432) Pulse Rate: 68 (01/27 0432)  Labs:  Recent Labs  10/15/13 1155 10/15/13 1322 10/15/13 2045  HGB 9.7* 9.9*  --   HCT 29.1* 29.0*  --   PLT 148*  --   --   LABPROT 24.5*  --   --   INR 2.29*  --   --   CREATININE  --  1.60*  --   TROPONINI  --   --  <0.30    Estimated Creatinine Clearance: 36.4 ml/min (by C-G formula based on Cr of 1.6).   Medical History: Past Medical History  Diagnosis Date  . Arthritis   . Renal disorder     kidney stones  . Renal insufficiency   . Bradycardia   . Hypertension   . Gout   . Coronary artery disease   . Pacemaker   . Leg weakness, bilateral     Medications:  Prescriptions prior to admission  Medication Sig Dispense Refill  . amiodarone (PACERONE) 200 MG tablet Take 200 mg by mouth daily.      Marland Kitchen aspirin EC 81 MG tablet Take 81 mg by mouth every morning.      . cholecalciferol (VITAMIN D) 1000 UNITS tablet Take 2,000 Units by mouth every morning.       . febuxostat (ULORIC) 40 MG tablet Take 40 mg by mouth every morning.       . fish oil-omega-3 fatty acids 1000 MG capsule Take 2 g by mouth every morning.       . folic acid (FOLVITE) 1 MG tablet Take 1 mg by mouth every morning.       . hydrALAZINE (APRESOLINE) 50 MG tablet Take 50 mg by mouth 3 (three) times daily.      . methocarbamol (ROBAXIN) 500 MG tablet Take 1 tablet (500 mg total) by mouth every 8 (eight) hours as needed (muscle soreness).  40 tablet  0  . nebivolol (BYSTOLIC) 10 MG tablet Take 10 mg by mouth every morning.        Marland Kitchen omeprazole (PRILOSEC) 40 MG capsule Take 40 mg by mouth every morning.      . pravastatin (PRAVACHOL) 10 MG tablet Take 10 mg by mouth at bedtime.      Marland Kitchen telmisartan (MICARDIS) 40 MG tablet Take 40 mg by mouth every morning.      . torsemide (DEMADEX) 10 MG tablet Take 10 mg by mouth daily.      Marland Kitchen warfarin (COUMADIN) 2.5 MG tablet Take 1.25-2.5 mg by mouth daily. Take 2.5mg  (whole tablet) every other day while alternating with 1.25mg  (1/2 tablets)        Assessment: 78 yo female admitted with chest pain, h/o Afib, to continue Coumadin  Goal of Therapy:  INR 2-3 Monitor platelets by anticoagulation protocol: Yes   Plan:  Daily INR  [name], Bronson Curb 10/16/2013,4:46 AM

## 2013-10-16 NOTE — Consult Note (Signed)
CARDIOLOGY CONSULT NOTE   Patient ID: [name] MRN: [id number], DOB/AGE: 1936-05-01   Admit date: 10/15/2013 Date of Consult: 10/16/2013   Primary Physician: Windell Hummingbird, PA-C Primary Cardiologist: Followed by a cardiologist at [facility]   Pt. Profile  Chest pain, unstable angina  Problem List  Past Medical History  Diagnosis Date  . Arthritis   . Renal disorder     kidney stones  . Renal insufficiency   . Bradycardia   . Hypertension   . Gout   . Coronary artery disease   . Pacemaker   . Leg weakness, bilateral     Past Surgical History  Procedure Laterality Date  . Abdominal hysterectomy    . Joint replacement      knee replacement  . Arm surgery    . Tummy tuck    . Shoulder surgery    . Pacemaker insertion    . Replacement total knee bilateral      Allergies  Allergies  Allergen Reactions  . Toprol Xl [Metoprolol Succinate] Rash and Other (See Comments)    Reaction: hair loss   HPI   [name] is a 78 y.o. female h/o hypertension, atrial fibrillation on chronic coumadin, pacemaker for symptomatic bradycardia, and CKD. The patient came to the ER complaining of chest pain and abdominal pain that woke her up yesterday morning. She states that this is the first episode since August of last year. The pain was retrosternal, pressure like radiating to her back and associated with significant SOB. The patient received NTG in the EMS that gave her some relief.  CT chest ruled out pulmonary embolism. She was found to have significantly elevated BP. She was pain free overnight but complains of chest pain after walking minimally around the room. Troponins negative x 3.  The patient states that she had a negative stress test about 3 years ago at her cardiologist office. She was also seen in the ER in August 2014 . At that time she improved with GI cocktail and was discharged for presumed GERD. No exertional chest pain in the meantime. No recent fever or  cough or chills. She checks her BP at home and states that it is usually controlled. She has never smoked.    Inpatient Medications  . amiodarone  200 mg Oral Daily  . aspirin EC  325 mg Oral q morning - 10a  . febuxostat  40 mg Oral q morning - 10a  . hydrALAZINE  50 mg Oral TID  . nebivolol  10 mg Oral q morning - 10a  . pantoprazole (PROTONIX) IV  40 mg Intravenous Q24H  . torsemide  10 mg Oral Daily  . Warfarin - Pharmacist Dosing Inpatient   Does not apply q1800    Family History History reviewed. No pertinent family history.   Social History History   Social History  . Marital Status: Divorced    Spouse Name: N/A    Number of Children: N/A  . Years of Education: N/A   Occupational History  . Not on file.   Social History Main Topics  . Smoking status: Never Smoker   . Smokeless tobacco: Not on file  . Alcohol Use: Yes     Comment: occasional  . Drug Use: No  . Sexual Activity: Not on file   Other Topics Concern  . Not on file   Social History Narrative  . No narrative on file    Review of Systems  General:  No chills, fever, night sweats or weight changes.  Cardiovascular:  No chest pain, dyspnea on exertion, edema, orthopnea, palpitations, paroxysmal nocturnal dyspnea. Dermatological: No rash, lesions/masses Respiratory: No cough, dyspnea Urologic: No hematuria, dysuria Abdominal:   No nausea, vomiting, diarrhea, bright red blood per rectum, melena, or hematemesis Neurologic:  No visual changes, wkns, changes in mental status. All other systems reviewed and are otherwise negative except as noted above.  Physical Exam  Blood pressure 148/62, pulse 64, temperature 98.3 F (36.8 C), temperature source Oral, resp. rate 16, height 5\' 5"  (1.651 m), weight 229 lb 11.2 oz (104.191 kg), SpO2 96.00%.  General: Pleasant, NAD Psych: Normal affect. Neuro: Alert and oriented X 3. Moves all extremities spontaneously. HEENT: Normal  Neck: Supple without bruits or  JVD. Lungs:  Resp regular and unlabored, CTA. Heart: RRR no s3, s4, or murmurs. Abdomen: Soft, non-tender, non-distended, BS + x 4.  Extremities: No clubbing, cyanosis or edema. DP/PT/Radials 2+ and equal bilaterally.  Labs   Recent Labs  10/15/13 2045 10/16/13 0520  TROPONINI <0.30 <0.30   Lab Results  Component Value Date   WBC 3.8* 10/16/2013   HGB 8.4* 10/16/2013   HCT 25.0* 10/16/2013   MCV 94.3 10/16/2013   PLT 135* 10/16/2013    Recent Labs Lab 10/16/13 0520  NA 141  K 4.4  CL 105  CO2 25  BUN 28*  CREATININE 1.55*  CALCIUM 9.0  PROT 6.2  BILITOT 0.5  ALKPHOS 299*  ALT 194*  AST 536*  GLUCOSE 102*    Radiology/Studies  Dg Chest 2 View  10/15/2013   CLINICAL DATA:  Hypertension and chest pain radiating to the back.  EXAM: CHEST  2 VIEW  COMPARISON:  DG CHEST 1V PORT dated 04/25/2013  FINDINGS: The lungs are mildly hyperinflated with hemidiaphragm flattening. There is blunting of the posterior and lateral costophrenic angles on the left which is not entirely new. The cardiac silhouette remains enlarged. The central pulmonary vascularity remains prominent. A permanent pacemaker is unchanged in appearance. There is tortuosity of the descending thoracic aorta. The observed portions of the bony thorax appear normal.  IMPRESSION: The findings are consistent with low grade CHF little changed from the previous study. There is no evidence of pneumonia.   Electronically Signed   By: David  Martinique   On: 10/15/2013 14:24   Ct Angio Chest Aorta W/cm &/or Wo/cm  10/15/2013   CLINICAL DATA:  Chest pain radiating into back.    IMPRESSION: No evidence of aneurysmal disease or dissection in the chest, abdomen or pelvis. Focal calcified plaque is identified in the distribution of the distal LAD. Prominence of central pulmonary arteries may reflect pulmonary hypertension.   Echocardiogram - none  ECG: Atrial pacing, early repolarization in inferolateral leads    ASSESSMENT AND  PLAN  78 year old female   1. Chest pain - with some typical and atypical features, considering her risk factors including poorly controlled HTN, hyperlipidemia obesity, and family h/o CAD and recurrent ER visits we would preferably perform a cardiac catheterization. However, INR is 2.8 today, we will proceed with a Lexiscan nuclear stress test instead. Echocardiogram is pending.  This might be an episode of acute cholecystitis based on the rapid liver enzymes elevation with a cholecystatic pattern, we will still proceed with a lexiscan stress if she needs preop clearance.   2. Severe liver enzymes elevation- rapidly worsening , rather cholecystatic rather than hepatocellular patter.? Cholecystitis, this might be the reason for her chest pain,  We will order liver US, call GI for consult   3. Hypertensive urgency - BP better controlled, we will start combinaton of Hydralazine/Imdur as it is a cheaper option of BiDil in this AA female with CHF.   4. CHF - acute on chronic, no echo available, but most probably diastolic and secondary touncontrolled HTN. We will rule out ischemia with stress test today. We are starting hydralazine/nitrate combination for CHF. Almost euvolemic after lasix overnight  5. Paroxysmal A-fib - on chronic coumadine, we will hold in case she needs a cath  6. Acute on chronic AKI - improving 2.4 --> 1.5   Signed, Dorothy Spark, MD, [facility] 10/16/2013, 10:09 AM

## 2013-10-16 NOTE — Progress Notes (Signed)
PHYSICIAN PROGRESS NOTE  [name] [id number] DOB: 05/26/1936 DOA: 10/15/2013  10/16/2013   PCP: Windell Hummingbird, PA-C   Assessment/Plan: 1. Chest pain - resolved now.  The patient is presenting with complaints of chest pain. Her pain appears atypical in nature. troponins are negative repeat EKGs are also not showing any acute ST-T wave changes.  She has undergone a CT angiogram which is negative for any pulmonary embolism. She had undergone a stress test 3 years ago which was also negative.  echocardiogram pending. I would put her on GI cocktail and Protonix for further pain control for possible GI causes.   2.Hypertension  Continue her home antihypertensive medications and adding hydralazine when necessary   3.History of pacemaker and atrial fibrillation - rate controlled, Continue Coumadin per pharmacy  4. Congestive heart Failure - ECHO pending, appears compensated at this time.   5. Anemia - Pt reports that this has been chronic.  Will monitor CBC, hemoccult stools, pt had colonoscopy several years ago and reports she was told she had colon polyps, she also reports that she has been followed by a hematologist.  If Hg remains stable pt should follow up with her outpatient GI and hematologist.  Consider inpatient GI consult if further drop in hemoglobin.   6. Elevated LFTs - large bump in LFTs, pt reports that she believes she had a cholecystectomy but is unsure, will check abdominal ultrasound today, recheck liver enzymes in AM.   7. Abdominal Pain - symptoms have resolved this morning. Will follow. [place] as above.    8. CKD - creatinine has been stable, following.   DVT Prophylaxis mechanical compression device  Nutrition: Cardiac diet but n.p.o.  Code Status: Full  Disposition: Admitted to observation in telemetry unit.   HPI/Subjective: Pt reports that she is feeling better, no further substernal chest pain that radiates to back or abdominal pain, has been NPO overnight.     Objective: Filed Vitals:   10/16/13 0432  BP: 150/81  Pulse: 68  Temp: 98.6 F (37 C)  Resp: 18    Intake/Output Summary (Last 24 hours) at 10/16/13 0910 Last data filed at 10/15/13 2300  Gross per 24 hour  Intake    240 ml  Output      0 ml  Net    240 ml   Filed Weights   10/15/13 1127 10/15/13 2300  Weight: 228 lb (103.42 kg) 229 lb 11.2 oz (104.191 kg)   Exam:  General: Alert, Awake and Oriented to Time, Place and Person. Appear in mild distress  Eyes: PERRL  ENT: Oral Mucosa clear moist.  Neck: no JVD  Cardiovascular: irreg, S1 and S2 Present, loud Murmur, Peripheral Pulses Present  Respiratory: BBS Clear to Auscultation, no Crackles,no wheezes  Abdomen: Bowel Sound Present, Soft and Non tender  Skin: no Rash  Extremities: no Pedal edema, no calf tenderness  Neurologic: no focal findings  Data Reviewed: Basic Metabolic Panel:  Recent Labs Lab 10/15/13 1322 10/16/13 0520  NA 141 141  K 4.7 4.4  CL 108 105  CO2  --  25  GLUCOSE 95 102*  BUN 41* 28*  CREATININE 1.60* 1.55*  CALCIUM  --  9.0   Liver Function Tests:  Recent Labs Lab 10/15/13 1614 10/16/13 0520  AST 149* 536*  ALT 38* 194*  ALKPHOS 160* 299*  BILITOT 0.7 0.5  PROT 7.0 6.2  ALBUMIN 3.3* 2.9*    Recent Labs Lab 10/15/13 1614  LIPASE 13   No  results found for this basename: AMMONIA,  in the last 168 hours CBC:  Recent Labs Lab 10/15/13 1155 10/15/13 1322 10/16/13 0520  WBC 5.8  --  3.8*  NEUTROABS 4.6  --  2.6  HGB 9.7* 9.9* 8.4*  HCT 29.1* 29.0* 25.0*  MCV 94.2  --  94.3  PLT 148*  --  135*   Cardiac Enzymes:  Recent Labs Lab 10/15/13 2045 10/16/13 0520  TROPONINI <0.30 <0.30   BNP (last 3 results)  Recent Labs  04/25/13 1220  PROBNP 1312.0*   CBG: No results found for this basename: GLUCAP,  in the last 168 hours  No results found for this or any previous visit (from the past 240 hour(s)).   Studies: Dg Chest 2 View  10/15/2013   CLINICAL  DATA:  Hypertension and chest pain radiating to the back.  EXAM: CHEST  2 VIEW  COMPARISON:  DG CHEST 1V PORT dated 04/25/2013  FINDINGS: The lungs are mildly hyperinflated with hemidiaphragm flattening. There is blunting of the posterior and lateral costophrenic angles on the left which is not entirely new. The cardiac silhouette remains enlarged. The central pulmonary vascularity remains prominent. A permanent pacemaker is unchanged in appearance. There is tortuosity of the descending thoracic aorta. The observed portions of the bony thorax appear normal.  IMPRESSION: The findings are consistent with low grade CHF little changed from the previous study. There is no evidence of pneumonia.   Electronically Signed   By: David  Martinique   On: 10/15/2013 14:24   Ct Angio Chest Aorta W/cm &/or Wo/cm  10/15/2013   CLINICAL DATA:  Chest pain radiating into back.  EXAM: CT ANGIOGRAPHY CHEST, ABDOMEN AND PELVIS  TECHNIQUE: Multidetector CT imaging through the chest, abdomen and pelvis was performed using the standard protocol during bolus administration of intravenous contrast. Multiplanar reconstructed images and MIPs were obtained and reviewed to evaluate the vascular anatomy.  CONTRAST:  72mL OMNIPAQUE IOHEXOL 350 MG/ML SOLN  COMPARISON:  None.  FINDINGS: CTA CHEST FINDINGS  The thoracic aorta is of normal caliber and demonstrates no evidence of dissection. Proximal great vessels show normal patency and branching pattern.  The heart is mildly enlarged. Underlying pacemaker is identified. Focally calcified plaque is identified in the distribution of the distal LAD. No pleural or pericardial fluid is identified.  Central pulmonary arteries are prominent in caliber. There is no evidence of pulmonary embolism. Lungs show scattered areas of scarring bilaterally without evidence of edema, infiltrate or nodule. No enlarged lymph nodes are identified.  Review of the MIP images confirms the above findings.  CTA ABDOMEN AND PELVIS  FINDINGS  The abdominal aorta is of normal caliber and shows no evidence of aneurysm or dissection. Iliac and common femoral arteries are normally patent bilaterally. Visceral branch vessels show no significant stenoses. The right renal artery demonstrates early bifurcation. The left kidney is supplied by 2 separate arteries emanating off of the abdominal aorta.  Solid organs of the abdomen show no significant abnormalities. There is cortical thinning of the left kidney. No abnormal fluid collections are identified. The gallbladder has been removed. No masses or enlarged lymph nodes are seen. No hernias are identified. The bladder is unremarkable. The uterus has been removed. Degenerative changes are present in the lumbar spine.  Review of the MIP images confirms the above findings.  IMPRESSION: No evidence of aneurysmal disease or dissection in the chest, abdomen or pelvis. Focal calcified plaque is identified in the distribution of the distal LAD. Prominence of  central pulmonary arteries may reflect pulmonary hypertension.   Electronically Signed   By: Aletta Edouard M.D.   On: 10/15/2013 19:03   Ct Cta Abd/pel W/cm &/or W/o Cm  10/15/2013   CLINICAL DATA:  Chest pain radiating into back.  EXAM: CT ANGIOGRAPHY CHEST, ABDOMEN AND PELVIS  TECHNIQUE: Multidetector CT imaging through the chest, abdomen and pelvis was performed using the standard protocol during bolus administration of intravenous contrast. Multiplanar reconstructed images and MIPs were obtained and reviewed to evaluate the vascular anatomy.  CONTRAST:  71mL OMNIPAQUE IOHEXOL 350 MG/ML SOLN  COMPARISON:  None.  FINDINGS: CTA CHEST FINDINGS  The thoracic aorta is of normal caliber and demonstrates no evidence of dissection. Proximal great vessels show normal patency and branching pattern.  The heart is mildly enlarged. Underlying pacemaker is identified. Focally calcified plaque is identified in the distribution of the distal LAD. No pleural or  pericardial fluid is identified.  Central pulmonary arteries are prominent in caliber. There is no evidence of pulmonary embolism. Lungs show scattered areas of scarring bilaterally without evidence of edema, infiltrate or nodule. No enlarged lymph nodes are identified.  Review of the MIP images confirms the above findings.  CTA ABDOMEN AND PELVIS FINDINGS  The abdominal aorta is of normal caliber and shows no evidence of aneurysm or dissection. Iliac and common femoral arteries are normally patent bilaterally. Visceral branch vessels show no significant stenoses. The right renal artery demonstrates early bifurcation. The left kidney is supplied by 2 separate arteries emanating off of the abdominal aorta.  Solid organs of the abdomen show no significant abnormalities. There is cortical thinning of the left kidney. No abnormal fluid collections are identified. The gallbladder has been removed. No masses or enlarged lymph nodes are seen. No hernias are identified. The bladder is unremarkable. The uterus has been removed. Degenerative changes are present in the lumbar spine.  Review of the MIP images confirms the above findings.  IMPRESSION: No evidence of aneurysmal disease or dissection in the chest, abdomen or pelvis. Focal calcified plaque is identified in the distribution of the distal LAD. Prominence of central pulmonary arteries may reflect pulmonary hypertension.   Electronically Signed   By: Aletta Edouard M.D.   On: 10/15/2013 19:03    Scheduled Meds: . amiodarone  200 mg Oral Daily  . aspirin EC  325 mg Oral q morning - 10a  . febuxostat  40 mg Oral q morning - 10a  . hydrALAZINE  50 mg Oral TID  . nebivolol  10 mg Oral q morning - 10a  . pantoprazole (PROTONIX) IV  40 mg Intravenous Q24H  . pravastatin  10 mg Oral Daily  . torsemide  10 mg Oral Daily  . Warfarin - Pharmacist Dosing Inpatient   Does not apply q1800   Continuous Infusions:   Principal Problem:   Chest pain Active  Problems:   CAD (coronary artery disease)   Hypertension   Pacemaker   Atrial fibrillation   [facility] Pager [phone]. If 7PM-7AM, please contact night-coverage at [email or web address], password [facility] 10/16/2013, 9:10 AM  LOS: 1 day

## 2013-10-16 NOTE — Discharge Instructions (Signed)
Information on my medicine - Coumadin®   (Warfarin) ° °This medication education was reviewed with me or my healthcare representative as part of my discharge preparation.  The pharmacist that spoke with me during my hospital stay was:  Caylin Raby P, RPH ° °Why was Coumadin prescribed for you? °Coumadin was prescribed for you because you have a blood clot or a medical condition that can cause an increased risk of forming blood clots. Blood clots can cause serious health problems by blocking the flow of blood to the heart, lung, or brain. Coumadin can prevent harmful blood clots from forming. °As a reminder your indication for Coumadin is:   Stroke Prevention Because Of Atrial Fibrillation ° °What test will check on my response to Coumadin? °While on Coumadin (warfarin) you will need to have an INR test regularly to ensure that your dose is keeping you in the desired range. The INR (international normalized ratio) number is calculated from the result of the laboratory test called prothrombin time (PT). ° °If an INR APPOINTMENT HAS NOT ALREADY BEEN MADE FOR YOU please schedule an appointment to have this lab work done by your health care provider within 7 days. °Your INR goal is usually a number between:  2 to 3 or your provider may give you a more narrow range like 2-2.5.  Ask your health care provider during an office visit what your goal INR is. ° °What  do you need to  know  About  COUMADIN? °Take Coumadin (warfarin) exactly as prescribed by your healthcare provider about the same time each day.  DO NOT stop taking without talking to the doctor who prescribed the medication.  Stopping without other blood clot prevention medication to take the place of Coumadin may increase your risk of developing a new clot or stroke.  Get refills before you run out. ° °What do you do if you miss a dose? °If you miss a dose, take it as soon as you remember on the same day then continue your regularly scheduled regimen the next day.   Do not take two doses of Coumadin at the same time. ° °Important Safety Information °A possible side effect of Coumadin (Warfarin) is an increased risk of bleeding. You should call your healthcare provider right away if you experience any of the following: °  Bleeding from an injury or your nose that does not stop. °  Unusual colored urine (red or dark brown) or unusual colored stools (red or black). °  Unusual bruising for unknown reasons. °  A serious fall or if you hit your head (even if there is no bleeding). ° °Some foods or medicines interact with Coumadin® (warfarin) and might alter your response to warfarin. To help avoid this: °  Eat a balanced diet, maintaining a consistent amount of Vitamin K. °  Notify your provider about major diet changes you plan to make. °  Avoid alcohol or limit your intake to 1 drink for women and 2 drinks for men per day. °(1 drink is 5 oz. wine, 12 oz. beer, or 1.5 oz. liquor.) ° °Make sure that ANY health care provider who prescribes medication for you knows that you are taking Coumadin (warfarin).  Also make sure the healthcare provider who is monitoring your Coumadin knows when you have started a new medication including herbals and non-prescription products. ° °Coumadin® (Warfarin)  Major Drug Interactions  °Increased Warfarin Effect Decreased Warfarin Effect  °Alcohol (large quantities) °Antibiotics (esp. Septra/Bactrim, Flagyl, Cipro) °Amiodarone (Cordarone) °Aspirin (  ASA) °Cimetidine (Tagamet) °Megestrol (Megace) °NSAIDs (ibuprofen, naproxen, etc.) °Piroxicam (Feldene) °Propafenone (Rythmol SR) °Propranolol (Inderal) °Isoniazid (INH) °Posaconazole (Noxafil) Barbiturates (Phenobarbital) °Carbamazepine (Tegretol) °Chlordiazepoxide (Librium) °Cholestyramine (Questran) °Griseofulvin °Oral Contraceptives °Rifampin °Sucralfate (Carafate) °Vitamin K  ° °Coumadin® (Warfarin) Major Herbal Interactions  °Increased Warfarin Effect Decreased Warfarin Effect  °Garlic °Ginseng °Ginkgo  biloba Coenzyme Q10 °Green tea °St. John’s wort   ° °Coumadin® (Warfarin) FOOD Interactions  °Eat a consistent number of servings per week of foods HIGH in Vitamin K °(1 serving = ½ cup)  °Collards (cooked, or boiled & drained) °Kale (cooked, or boiled & drained) °Mustard greens (cooked, or boiled & drained) °Parsley *serving size only = ¼ cup °Spinach (cooked, or boiled & drained) °Swiss chard (cooked, or boiled & drained) °Turnip greens (cooked, or boiled & drained)  °Eat a consistent number of servings per week of foods MEDIUM-HIGH in Vitamin K °(1 serving = 1 cup)  °Asparagus (cooked, or boiled & drained) °Broccoli (cooked, boiled & drained, or raw & chopped) °Brussel sprouts (cooked, or boiled & drained) *serving size only = ½ cup °Lettuce, raw (green leaf, endive, romaine) °Spinach, raw °Turnip greens, raw & chopped  ° °These websites have more information on Coumadin (warfarin):  www.coumadin.com; °www.ahrq.gov/consumer/coumadin.htm; ° ° °

## 2013-10-17 ENCOUNTER — Observation Stay (HOSPITAL_COMMUNITY): Payer: PRIVATE HEALTH INSURANCE

## 2013-10-17 DIAGNOSIS — I4891 Unspecified atrial fibrillation: Secondary | ICD-10-CM

## 2013-10-17 DIAGNOSIS — N183 Chronic kidney disease, stage 3 unspecified: Secondary | ICD-10-CM

## 2013-10-17 DIAGNOSIS — Z950 Presence of cardiac pacemaker: Secondary | ICD-10-CM

## 2013-10-17 DIAGNOSIS — R1013 Epigastric pain: Secondary | ICD-10-CM

## 2013-10-17 DIAGNOSIS — I2510 Atherosclerotic heart disease of native coronary artery without angina pectoris: Secondary | ICD-10-CM

## 2013-10-17 LAB — COMPREHENSIVE METABOLIC PANEL
ALT: 128 U/L — ABNORMAL HIGH (ref 0–35)
AST: 143 U/L — AB (ref 0–37)
Albumin: 3 g/dL — ABNORMAL LOW (ref 3.5–5.2)
Alkaline Phosphatase: 248 U/L — ABNORMAL HIGH (ref 39–117)
BILIRUBIN TOTAL: 0.3 mg/dL (ref 0.3–1.2)
BUN: 31 mg/dL — AB (ref 6–23)
CHLORIDE: 104 meq/L (ref 96–112)
CO2: 20 meq/L (ref 19–32)
CREATININE: 1.89 mg/dL — AB (ref 0.50–1.10)
Calcium: 9.3 mg/dL (ref 8.4–10.5)
GFR calc Af Amer: 29 mL/min — ABNORMAL LOW (ref >90)
GFR, EST NON AFRICAN AMERICAN: 25 mL/min — AB (ref >90)
Glucose, Bld: 110 mg/dL — ABNORMAL HIGH (ref 70–99)
POTASSIUM: 4.6 meq/L (ref 3.7–5.3)
Sodium: 142 mEq/L (ref 137–147)
Total Protein: 6.8 g/dL (ref 6.0–8.3)

## 2013-10-17 LAB — CBC
HEMATOCRIT: 26.4 % — AB (ref 36.0–46.0)
Hemoglobin: 8.9 g/dL — ABNORMAL LOW (ref 12.0–15.0)
MCH: 32 pg (ref 26.0–34.0)
MCHC: 33.7 g/dL (ref 30.0–36.0)
MCV: 95 fL (ref 78.0–100.0)
PLATELETS: 152 10*3/uL (ref 150–400)
RBC: 2.78 MIL/uL — AB (ref 3.87–5.11)
RDW: 16.1 % — AB (ref 11.5–15.5)
WBC: 4.9 10*3/uL (ref 4.0–10.5)

## 2013-10-17 LAB — PROTIME-INR
INR: 2.28 — AB (ref 0.00–1.49)
Prothrombin Time: 24.4 seconds — ABNORMAL HIGH (ref 11.6–15.2)

## 2013-10-17 NOTE — Progress Notes (Signed)
Patient ID: [name], female   DOB: 11-May-1936, 78 y.o.   MRN: [id number]    SUBJECTIVE: Patient feels today. She's not having any recurrent symptoms. Stress nuclear scan revealed no significant ischemia. Ejection fraction was normal.   Filed Vitals:   10/16/13 2351 10/17/13 0402 10/17/13 0809 10/17/13 1102  BP: 149/73 133/64 171/80 130/64  Pulse: 64 71 72   Temp: 98.1 F (36.7 C) 97.9 F (36.6 C) 98.1 F (36.7 C)   TempSrc:  Oral Oral   Resp: 16 18 19    Height:      Weight:      SpO2: 98% 98% 100%     No intake or output data in the 24 hours ending 10/17/13 1155  LABS: Basic Metabolic Panel:  Recent Labs  10/16/13 0520 10/17/13 0426  NA 141 142  K 4.4 4.6  CL 105 104  CO2 25 20  GLUCOSE 102* 110*  BUN 28* 31*  CREATININE 1.55* 1.89*  CALCIUM 9.0 9.3   Liver Function Tests:  Recent Labs  10/16/13 0520 10/17/13 0426  AST 536* 143*  ALT 194* 128*  ALKPHOS 299* 248*  BILITOT 0.5 0.3  PROT 6.2 6.8  ALBUMIN 2.9* 3.0*    Recent Labs  10/15/13 1614  LIPASE 13   CBC:  Recent Labs  10/15/13 1155  10/16/13 0520 10/17/13 0426  WBC 5.8  --  3.8* 4.9  NEUTROABS 4.6  --  2.6  --   HGB 9.7*  < > 8.4* 8.9*  HCT 29.1*  < > 25.0* 26.4*  MCV 94.2  --  94.3 95.0  PLT 148*  --  135* 152  < > = values in this interval not displayed. Cardiac Enzymes:  Recent Labs  10/15/13 2045 10/16/13 0520  TROPONINI <0.30 <0.30   BNP: No components found with this basename: POCBNP,  D-Dimer: No results found for this basename: DDIMER,  in the last 72 hours Hemoglobin A1C: No results found for this basename: HGBA1C,  in the last 72 hours Fasting Lipid Panel: No results found for this basename: CHOL, HDL, LDLCALC, TRIG, CHOLHDL, LDLDIRECT,  in the last 72 hours Thyroid Function Tests: No results found for this basename: TSH, T4TOTAL, FREET3, T3FREE, THYROIDAB,  in the last 72 hours  RADIOLOGY: Dg Chest 2 View  10/15/2013   CLINICAL DATA:  Hypertension and chest  pain radiating to the back.  EXAM: CHEST  2 VIEW  COMPARISON:  DG CHEST 1V PORT dated 04/25/2013  FINDINGS: The lungs are mildly hyperinflated with hemidiaphragm flattening. There is blunting of the posterior and lateral costophrenic angles on the left which is not entirely new. The cardiac silhouette remains enlarged. The central pulmonary vascularity remains prominent. A permanent pacemaker is unchanged in appearance. There is tortuosity of the descending thoracic aorta. The observed portions of the bony thorax appear normal.  IMPRESSION: The findings are consistent with low grade CHF little changed from the previous study. There is no evidence of pneumonia.   Electronically Signed   By: David  Martinique   On: 10/15/2013 14:24   US Abdomen Complete  10/17/2013   CLINICAL DATA:  Evaluate for cholecystitis.  EXAM: ULTRASOUND ABDOMEN COMPLETE  COMPARISON:  10/15/2013.  FINDINGS: Gallbladder:  As demonstrated on today's CT, the gallbladder has been surgically removed  Common bile duct:  Diameter: Nonspecific mild prominence up to 8 mm.  Liver:  Heterogeneous/ increased echogenicity. Focal lesion detection is suboptimal in this setting.  IVC:  No abnormality visualized.  Pancreas:  Visualized portion unremarkable.  Spleen:  Size and appearance within normal limits.  Right Kidney:  Length: 9.6 cm. Mild renal cortical atrophy. Echogenicity within normal limits. No mass or hydronephrosis visualized.  Left Kidney:  Length: 9.2 cm. Echogenic and areas of scarring/ renal atrophy. Fullness of the left renal pelvis is similar to CT.  Abdominal aorta:  No aneurysm visualized.  Other findings:  None.  IMPRESSION: Status post cholecystectomy. Mild biliary ductal prominence is nonspecific and can be incidental in this setting. Correlate with LFTs and ERCP if clinically warranted.  Lobular renal contours with atrophy (left greater than right).   Electronically Signed   By: Carlos Levering M.D.   On: 10/17/2013 05:53   Ct Angio  Chest Aorta W/cm &/or Wo/cm  10/15/2013   CLINICAL DATA:  Chest pain radiating into back.  EXAM: CT ANGIOGRAPHY CHEST, ABDOMEN AND PELVIS  TECHNIQUE: Multidetector CT imaging through the chest, abdomen and pelvis was performed using the standard protocol during bolus administration of intravenous contrast. Multiplanar reconstructed images and MIPs were obtained and reviewed to evaluate the vascular anatomy.  CONTRAST:  6mL OMNIPAQUE IOHEXOL 350 MG/ML SOLN  COMPARISON:  None.  FINDINGS: CTA CHEST FINDINGS  The thoracic aorta is of normal caliber and demonstrates no evidence of dissection. Proximal great vessels show normal patency and branching pattern.  The heart is mildly enlarged. Underlying pacemaker is identified. Focally calcified plaque is identified in the distribution of the distal LAD. No pleural or pericardial fluid is identified.  Central pulmonary arteries are prominent in caliber. There is no evidence of pulmonary embolism. Lungs show scattered areas of scarring bilaterally without evidence of edema, infiltrate or nodule. No enlarged lymph nodes are identified.  Review of the MIP images confirms the above findings.  CTA ABDOMEN AND PELVIS FINDINGS  The abdominal aorta is of normal caliber and shows no evidence of aneurysm or dissection. Iliac and common femoral arteries are normally patent bilaterally. Visceral branch vessels show no significant stenoses. The right renal artery demonstrates early bifurcation. The left kidney is supplied by 2 separate arteries emanating off of the abdominal aorta.  Solid organs of the abdomen show no significant abnormalities. There is cortical thinning of the left kidney. No abnormal fluid collections are identified. The gallbladder has been removed. No masses or enlarged lymph nodes are seen. No hernias are identified. The bladder is unremarkable. The uterus has been removed. Degenerative changes are present in the lumbar spine.  Review of the MIP images confirms the  above findings.  IMPRESSION: No evidence of aneurysmal disease or dissection in the chest, abdomen or pelvis. Focal calcified plaque is identified in the distribution of the distal LAD. Prominence of central pulmonary arteries may reflect pulmonary hypertension.   Electronically Signed   By: Aletta Edouard M.D.   On: 10/15/2013 19:03   Ct Cta Abd/pel W/cm &/or W/o Cm  10/15/2013   CLINICAL DATA:  Chest pain radiating into back.  EXAM: CT ANGIOGRAPHY CHEST, ABDOMEN AND PELVIS  TECHNIQUE: Multidetector CT imaging through the chest, abdomen and pelvis was performed using the standard protocol during bolus administration of intravenous contrast. Multiplanar reconstructed images and MIPs were obtained and reviewed to evaluate the vascular anatomy.  CONTRAST:  22mL OMNIPAQUE IOHEXOL 350 MG/ML SOLN  COMPARISON:  None.  FINDINGS: CTA CHEST FINDINGS  The thoracic aorta is of normal caliber and demonstrates no evidence of dissection. Proximal great vessels show normal patency and branching pattern.  The heart is mildly enlarged. Underlying pacemaker is identified.  Focally calcified plaque is identified in the distribution of the distal LAD. No pleural or pericardial fluid is identified.  Central pulmonary arteries are prominent in caliber. There is no evidence of pulmonary embolism. Lungs show scattered areas of scarring bilaterally without evidence of edema, infiltrate or nodule. No enlarged lymph nodes are identified.  Review of the MIP images confirms the above findings.  CTA ABDOMEN AND PELVIS FINDINGS  The abdominal aorta is of normal caliber and shows no evidence of aneurysm or dissection. Iliac and common femoral arteries are normally patent bilaterally. Visceral branch vessels show no significant stenoses. The right renal artery demonstrates early bifurcation. The left kidney is supplied by 2 separate arteries emanating off of the abdominal aorta.  Solid organs of the abdomen show no significant abnormalities.  There is cortical thinning of the left kidney. No abnormal fluid collections are identified. The gallbladder has been removed. No masses or enlarged lymph nodes are seen. No hernias are identified. The bladder is unremarkable. The uterus has been removed. Degenerative changes are present in the lumbar spine.  Review of the MIP images confirms the above findings.  IMPRESSION: No evidence of aneurysmal disease or dissection in the chest, abdomen or pelvis. Focal calcified plaque is identified in the distribution of the distal LAD. Prominence of central pulmonary arteries may reflect pulmonary hypertension.   Electronically Signed   By: Aletta Edouard M.D.   On: 10/15/2013 19:03    PHYSICAL EXAM  patient is stable. She is oriented to person time and place. Affect is normal. There is no jugular venous distention. Lungs are clear. Respiratory effort is unlabored. Cardiac exam was S1-S2. There is no significant peripheral edema.   TELEMETRY:  I have reviewed telemetry today October 17, 2013. The rhythm is paced. There are no tachyarrhythmias documented.   ASSESSMENT AND PLAN:     Chest pain     At this point there is no definite evidence of an acute coronary syndrome. Nuclear stress study revealed no ischemia. Ejection fraction was normal. No further workup is indicated at this time.    CAD (coronary artery disease)   Hypertension   Pacemaker   Atrial fibrillation    Elevated liver enzymes   Abdominal pain, epigastric      The ultrasound is not show any obvious obstruction. However we cannot rule out the possibility that a common duct stone has been passed.    Acute on chronic renal insufficiency  From the cardiac viewpoint no further evaluation is indicated. The patient will require early followup with her cardiologist at home post hospitalization.   Dola Argyle 10/17/2013 11:55 AM

## 2013-10-17 NOTE — Discharge Summary (Signed)
Physician Discharge Summary  [name] [id number] DOB: 30-Jul-1936 DOA: 10/15/2013  PCP: Windell Hummingbird, PA-C  Admit date: 10/15/2013 Discharge date: 10/17/2013  Time spent: 45 minutes  Recommendations for Outpatient Follow-up:  -Will be discharged home today. -Advised to follow up with PCP in 1 week. -At time of follow up, will need a CMET to ensure her LFTs continue to trend downwards.   Discharge Diagnoses:  Principal Problem:   Chest pain Active Problems:   CAD (coronary artery disease)   Hypertension   Pacemaker   Atrial fibrillation   Elevated liver enzymes   Abdominal pain, epigastric   Acute on chronic renal insufficiency   CKD (chronic kidney disease) stage 3, GFR 30-59 ml/min   Discharge Condition: Stable and improved  Filed Weights   10/15/13 1127 10/15/13 2300  Weight: 103.42 kg (228 lb) 104.191 kg (229 lb 11.2 oz)    History of present illness:  Andrews Denise is a 78 y.o. female with Past medical history of Hypertension, atrial fibrillation on Coumadin, pacemaker, renal stones.  The patient is coming from home.  The patient presented with complaints of chest pain that woke her up this morning. The pain was located substernally and that was radiating to her back. When she came to the ED the pain radiated to her abdomen. She mentions the pain has been on and off but has never resolved completely. She was given a dose of nitroglycerin which she mentions has improved her pain a little.  She denies any similar pain on exertion. She denies any dyspnea on exertion but denies any nausea vomiting diaphoresis. No recent fever or cough or chills. Hospitalist admission was requested.   Hospital Course:   1. Chest pain - resolved now.  The patient is presenting with complaints of chest pain. Her pain appears atypical in nature. troponins are negative repeat EKGs are also not showing any acute ST-T wave changes. She has undergone a CT angiogram which is negative for any  pulmonary embolism. Stress test this admission with an EF 66% and no ischemic or EKG changes. Cardiology is not planning on further cardiac work up at this point.   2.Hypertension  Continue her home antihypertensive medications.  3.History of pacemaker and atrial fibrillation - rate controlled, Continue Coumadin per pharmacy. INR therapeutic at 2.28 on DC.  5. Anemia - Pt reports that this has been chronic. Will monitor CBC, hemoccult stools, pt had colonoscopy several years ago and reports she was told she had colon polyps, she also reports that she has been followed by a hematologist. If Hg remains stable pt should follow up with her outpatient GI and hematologist. Hb as been stable in the upper 8-low 9 range.   6. Elevated LFTs - LFTs trending down. Abdominal pain resolved. Has a h/o cholecystectomy. Abdominal US: Mild biliary ductal prominence is  nonspecific and can be incidental in this setting. Will need to see her PCP in 1 week to ensure LFTs continue their downward trend.  7. Abdominal Pain - symptoms have resolved this morning. Will follow. [place] as above.   8. CKD Stage III - creatinine has been stable, following.    Procedures:  As above and below   Consultations:  Cardiology  Discharge Instructions      Discharge Orders   Future Orders Complete By Expires   Diet - low sodium heart healthy  As directed    Discontinue IV  As directed    Increase activity slowly  As directed  Medication List         amiodarone 200 MG tablet  Commonly known as:  PACERONE  Take 200 mg by mouth daily.     aspirin EC 81 MG tablet  Take 81 mg by mouth every morning.     cholecalciferol 1000 UNITS tablet  Commonly known as:  VITAMIN D  Take 2,000 Units by mouth every morning.     febuxostat 40 MG tablet  Commonly known as:  ULORIC  Take 40 mg by mouth every morning.     fish oil-omega-3 fatty acids 1000 MG capsule  Take 2 g by mouth every morning.     folic acid 1 MG  tablet  Commonly known as:  FOLVITE  Take 1 mg by mouth every morning.     hydrALAZINE 50 MG tablet  Commonly known as:  APRESOLINE  Take 50 mg by mouth 3 (three) times daily.     methocarbamol 500 MG tablet  Commonly known as:  ROBAXIN  Take 1 tablet (500 mg total) by mouth every 8 (eight) hours as needed (muscle soreness).     nebivolol 10 MG tablet  Commonly known as:  BYSTOLIC  Take 10 mg by mouth every morning.     omeprazole 40 MG capsule  Commonly known as:  PRILOSEC  Take 40 mg by mouth every morning.     pravastatin 10 MG tablet  Commonly known as:  PRAVACHOL  Take 10 mg by mouth at bedtime.     telmisartan 40 MG tablet  Commonly known as:  MICARDIS  Take 40 mg by mouth every morning.     torsemide 10 MG tablet  Commonly known as:  DEMADEX  Take 10 mg by mouth daily.     warfarin 2.5 MG tablet  Commonly known as:  COUMADIN  Take 1.25-2.5 mg by mouth daily. Take 2.5mg  (whole tablet) every other day while alternating with 1.25mg  (1/2 tablets)       Allergies  Allergen Reactions  . Toprol Xl [Metoprolol Succinate] Rash and Other (See Comments)    Reaction: hair loss   Follow-up Information   Follow up with Lovena Le, AMANDA, PA-C. Schedule an appointment as soon as possible for a visit in 1 week.   Specialty:  Physician Assistant   Contact information:   [address] [place] [address] [phone]        The results of significant diagnostics from this hospitalization (including imaging, microbiology, ancillary and laboratory) are listed below for reference.    Significant Diagnostic Studies: Dg Chest 2 View  10/15/2013   CLINICAL DATA:  Hypertension and chest pain radiating to the back.  EXAM: CHEST  2 VIEW  COMPARISON:  DG CHEST 1V PORT dated 04/25/2013  FINDINGS: The lungs are mildly hyperinflated with hemidiaphragm flattening. There is blunting of the posterior and lateral costophrenic angles on the left which is not entirely new. The cardiac  silhouette remains enlarged. The central pulmonary vascularity remains prominent. A permanent pacemaker is unchanged in appearance. There is tortuosity of the descending thoracic aorta. The observed portions of the bony thorax appear normal.  IMPRESSION: The findings are consistent with low grade CHF little changed from the previous study. There is no evidence of pneumonia.   Electronically Signed   By: David  [place]   On: 10/15/2013 14:24   US Abdomen Complete  10/17/2013   CLINICAL DATA:  Evaluate for cholecystitis.  EXAM: ULTRASOUND ABDOMEN COMPLETE  COMPARISON:  10/15/2013.  FINDINGS: Gallbladder:  As demonstrated  on today's CT, the gallbladder has been surgically removed  Common bile duct:  Diameter: Nonspecific mild prominence up to 8 mm.  Liver:  Heterogeneous/ increased echogenicity. Focal lesion detection is suboptimal in this setting.  IVC:  No abnormality visualized.  Pancreas:  Visualized portion unremarkable.  Spleen:  Size and appearance within normal limits.  Right Kidney:  Length: 9.6 cm. Mild renal cortical atrophy. Echogenicity within normal limits. No mass or hydronephrosis visualized.  Left Kidney:  Length: 9.2 cm. Echogenic and areas of scarring/ renal atrophy. Fullness of the left renal pelvis is similar to CT.  Abdominal aorta:  No aneurysm visualized.  Other findings:  None.  IMPRESSION: Status post cholecystectomy. Mild biliary ductal prominence is nonspecific and can be incidental in this setting. Correlate with LFTs and ERCP if clinically warranted.  Lobular renal contours with atrophy (left greater than right).   Electronically Signed   By: Carlos Levering M.D.   On: 10/17/2013 05:53   Ct Angio Chest Aorta W/cm &/or Wo/cm  10/15/2013   CLINICAL DATA:  Chest pain radiating into back.  EXAM: CT ANGIOGRAPHY CHEST, ABDOMEN AND PELVIS  TECHNIQUE: Multidetector CT imaging through the chest, abdomen and pelvis was performed using the standard protocol during bolus administration of  intravenous contrast. Multiplanar reconstructed images and MIPs were obtained and reviewed to evaluate the vascular anatomy.  CONTRAST:  41mL OMNIPAQUE IOHEXOL 350 MG/ML SOLN  COMPARISON:  None.  FINDINGS: CTA CHEST FINDINGS  The thoracic aorta is of normal caliber and demonstrates no evidence of dissection. Proximal great vessels show normal patency and branching pattern.  The heart is mildly enlarged. Underlying pacemaker is identified. Focally calcified plaque is identified in the distribution of the distal LAD. No pleural or pericardial fluid is identified.  Central pulmonary arteries are prominent in caliber. There is no evidence of pulmonary embolism. Lungs show scattered areas of scarring bilaterally without evidence of edema, infiltrate or nodule. No enlarged lymph nodes are identified.  Review of the MIP images confirms the above findings.  CTA ABDOMEN AND PELVIS FINDINGS  The abdominal aorta is of normal caliber and shows no evidence of aneurysm or dissection. Iliac and common femoral arteries are normally patent bilaterally. Visceral branch vessels show no significant stenoses. The right renal artery demonstrates early bifurcation. The left kidney is supplied by 2 separate arteries emanating off of the abdominal aorta.  Solid organs of the abdomen show no significant abnormalities. There is cortical thinning of the left kidney. No abnormal fluid collections are identified. The gallbladder has been removed. No masses or enlarged lymph nodes are seen. No hernias are identified. The bladder is unremarkable. The uterus has been removed. Degenerative changes are present in the lumbar spine.  Review of the MIP images confirms the above findings.  IMPRESSION: No evidence of aneurysmal disease or dissection in the chest, abdomen or pelvis. Focal calcified plaque is identified in the distribution of the distal LAD. Prominence of central pulmonary arteries may reflect pulmonary hypertension.   Electronically Signed    By: Aletta Edouard M.D.   On: 10/15/2013 19:03   Ct Cta Abd/pel W/cm &/or W/o Cm  10/15/2013   CLINICAL DATA:  Chest pain radiating into back.  EXAM: CT ANGIOGRAPHY CHEST, ABDOMEN AND PELVIS  TECHNIQUE: Multidetector CT imaging through the chest, abdomen and pelvis was performed using the standard protocol during bolus administration of intravenous contrast. Multiplanar reconstructed images and MIPs were obtained and reviewed to evaluate the vascular anatomy.  CONTRAST:  65mL OMNIPAQUE IOHEXOL  350 MG/ML SOLN  COMPARISON:  None.  FINDINGS: CTA CHEST FINDINGS  The thoracic aorta is of normal caliber and demonstrates no evidence of dissection. Proximal great vessels show normal patency and branching pattern.  The heart is mildly enlarged. Underlying pacemaker is identified. Focally calcified plaque is identified in the distribution of the distal LAD. No pleural or pericardial fluid is identified.  Central pulmonary arteries are prominent in caliber. There is no evidence of pulmonary embolism. Lungs show scattered areas of scarring bilaterally without evidence of edema, infiltrate or nodule. No enlarged lymph nodes are identified.  Review of the MIP images confirms the above findings.  CTA ABDOMEN AND PELVIS FINDINGS  The abdominal aorta is of normal caliber and shows no evidence of aneurysm or dissection. Iliac and common femoral arteries are normally patent bilaterally. Visceral branch vessels show no significant stenoses. The right renal artery demonstrates early bifurcation. The left kidney is supplied by 2 separate arteries emanating off of the abdominal aorta.  Solid organs of the abdomen show no significant abnormalities. There is cortical thinning of the left kidney. No abnormal fluid collections are identified. The gallbladder has been removed. No masses or enlarged lymph nodes are seen. No hernias are identified. The bladder is unremarkable. The uterus has been removed. Degenerative changes are present  in the lumbar spine.  Review of the MIP images confirms the above findings.  IMPRESSION: No evidence of aneurysmal disease or dissection in the chest, abdomen or pelvis. Focal calcified plaque is identified in the distribution of the distal LAD. Prominence of central pulmonary arteries may reflect pulmonary hypertension.   Electronically Signed   By: Aletta Edouard M.D.   On: 10/15/2013 19:03    Microbiology: No results found for this or any previous visit (from the past 240 hour(s)).   Labs: Basic Metabolic Panel:  Recent Labs Lab 10/15/13 1322 10/16/13 0520 10/17/13 0426  NA 141 141 142  K 4.7 4.4 4.6  CL 108 105 104  CO2  --  25 20  GLUCOSE 95 102* 110*  BUN 41* 28* 31*  CREATININE 1.60* 1.55* 1.89*  CALCIUM  --  9.0 9.3   Liver Function Tests:  Recent Labs Lab 10/15/13 1614 10/16/13 0520 10/17/13 0426  AST 149* 536* 143*  ALT 38* 194* 128*  ALKPHOS 160* 299* 248*  BILITOT 0.7 0.5 0.3  PROT 7.0 6.2 6.8  ALBUMIN 3.3* 2.9* 3.0*    Recent Labs Lab 10/15/13 1614  LIPASE 13   No results found for this basename: AMMONIA,  in the last 168 hours CBC:  Recent Labs Lab 10/15/13 1155 10/15/13 1322 10/16/13 0520 10/17/13 0426  WBC 5.8  --  3.8* 4.9  NEUTROABS 4.6  --  2.6  --   HGB 9.7* 9.9* 8.4* 8.9*  HCT 29.1* 29.0* 25.0* 26.4*  MCV 94.2  --  94.3 95.0  PLT 148*  --  135* 152   Cardiac Enzymes:  Recent Labs Lab 10/15/13 2045 10/16/13 0520  TROPONINI <0.30 <0.30   BNP: BNP (last 3 results)  Recent Labs  04/25/13 1220  PROBNP 1312.0*   CBG: No results found for this basename: GLUCAP,  in the last 168 hours     Signed:  Lelon Frohlich  [facility] Pager: [phone] 10/17/2013, 2:09 PM

## 2014-04-28 IMAGING — CT CT ANGIO CHEST
2 of 10 series · 18 of 46 positions shown · IV contrast (omnipaque)
Comparison: None.

CLINICAL DATA: Chest pain radiating into back.

EXAM:
CT ANGIOGRAPHY CHEST, ABDOMEN AND PELVIS
TECHNIQUE: Multidetector CT imaging through the chest, abdomen and pelvis was
performed using the standard protocol during bolus administration of
intravenous contrast. Multiplanar reconstructed images and MIPs were
obtained and reviewed to evaluate the vascular anatomy.
CONTRAST:  80mL OMNIPAQUE IOHEXOL 350 MG/ML SOLN

[Series 6: dissection 1.0 i30f 3 · axial · 0.74mm/px · z∈[-497,-31]mm · 15 of 526 slices shown]
[im 30/526  lung]
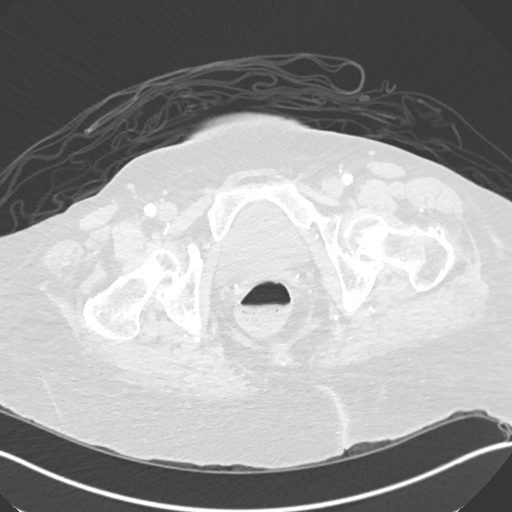
[im 59/526  soft-tissue]
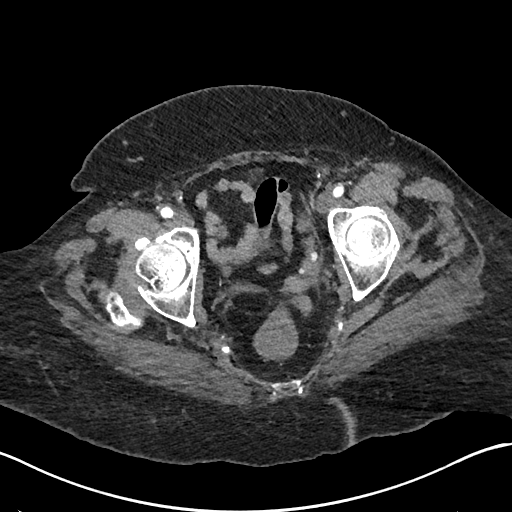
[im 88/526  lung]
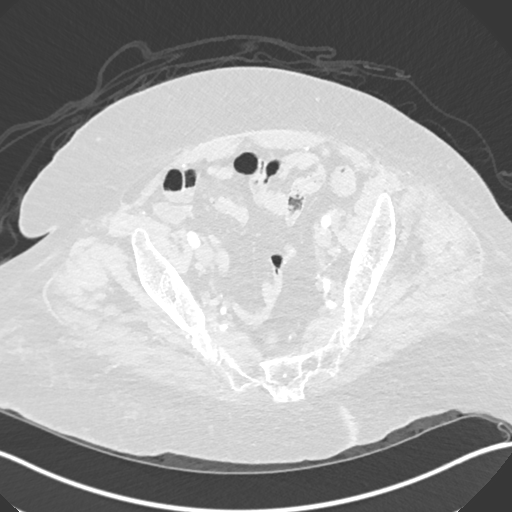
[im 117/526  soft-tissue]
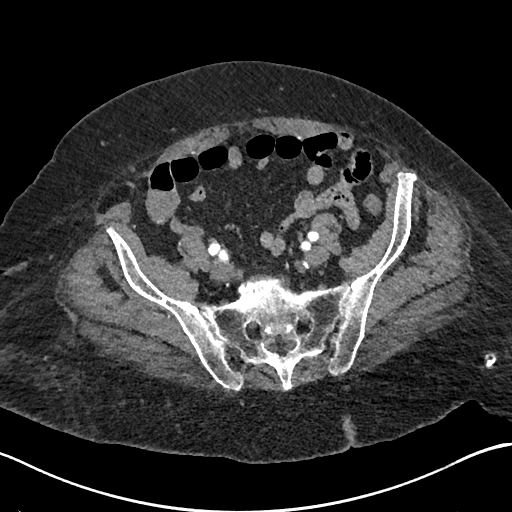
[im 176/526  lung]
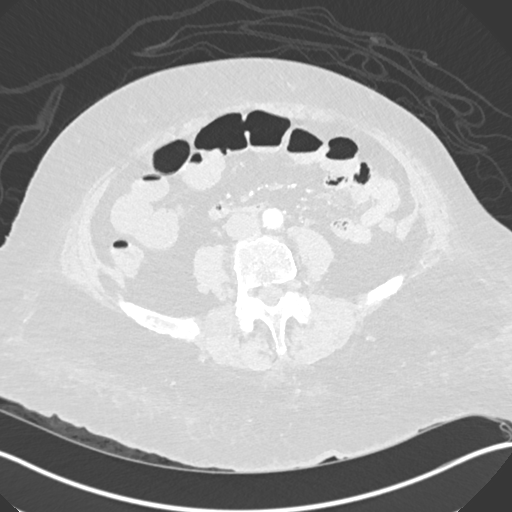
[im 205/526  soft-tissue]
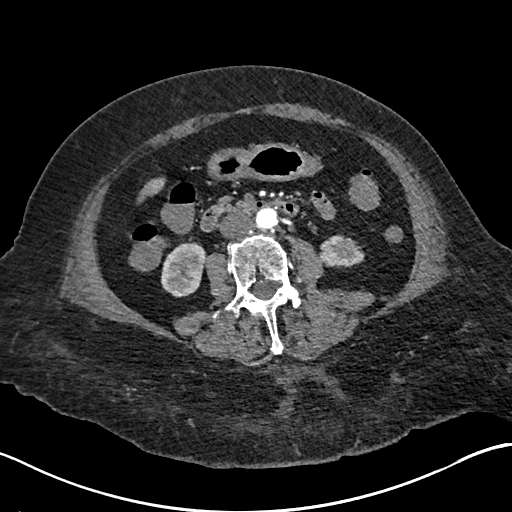
[im 234/526  lung]
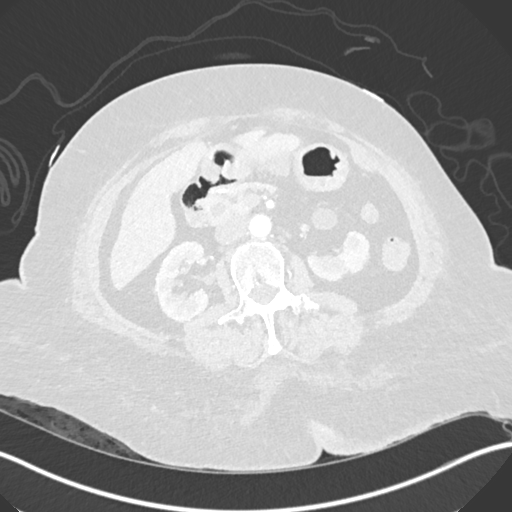
[im 263/526  soft-tissue]
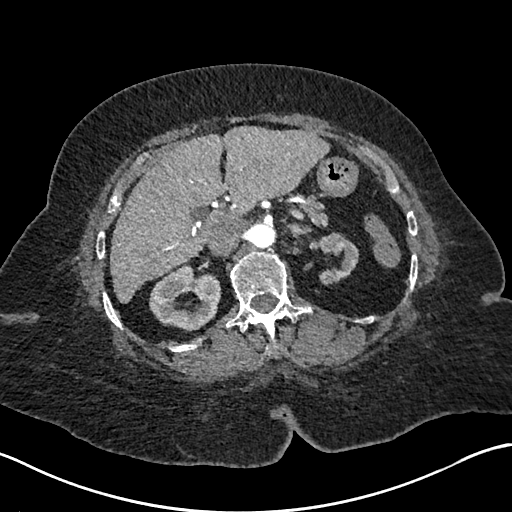
[im 292/526  lung]
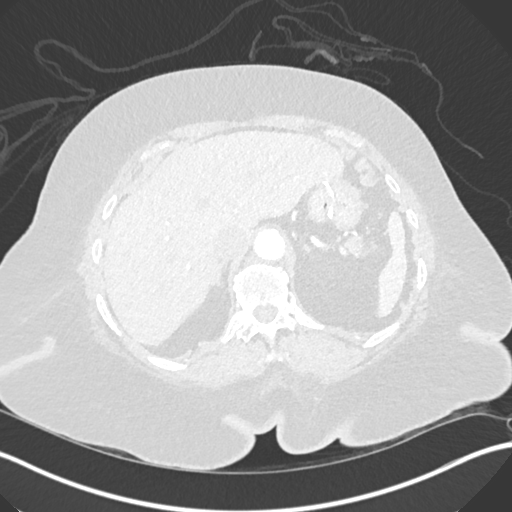
[im 321/526  soft-tissue]
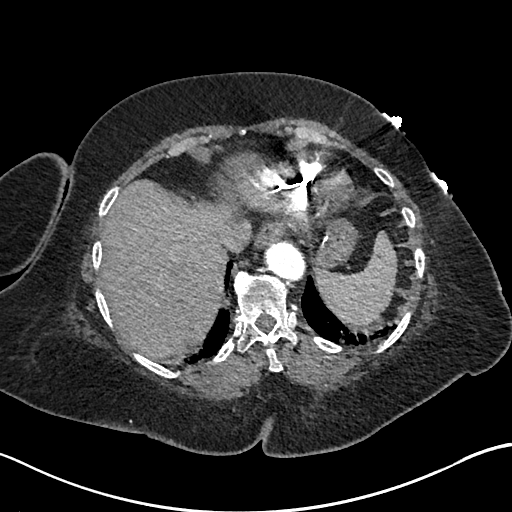
[im 351/526  lung]
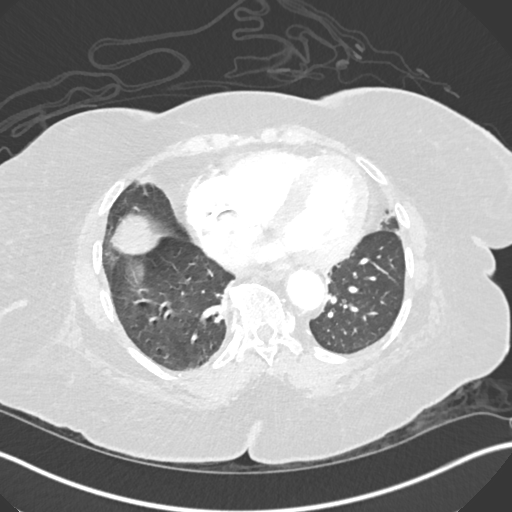
[im 409/526  soft-tissue]
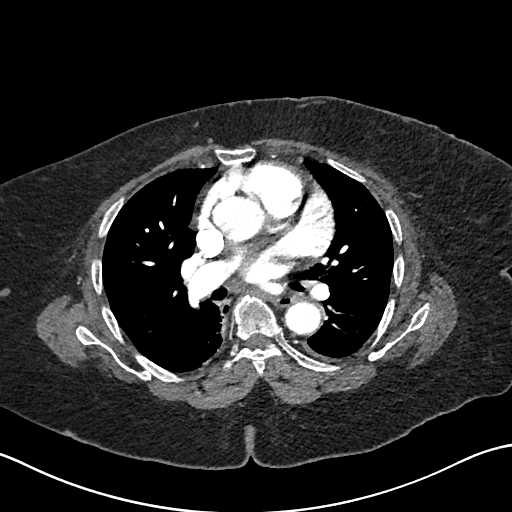
[im 438/526  lung]
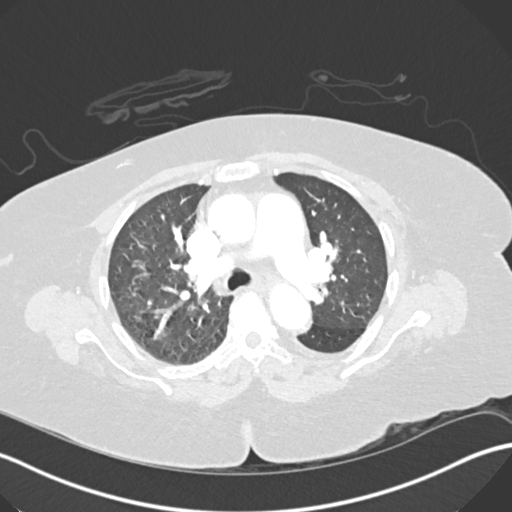
[im 467/526  soft-tissue]
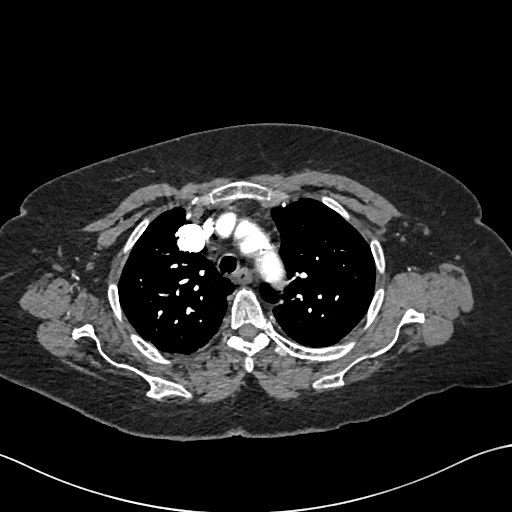
[im 496/526  lung]
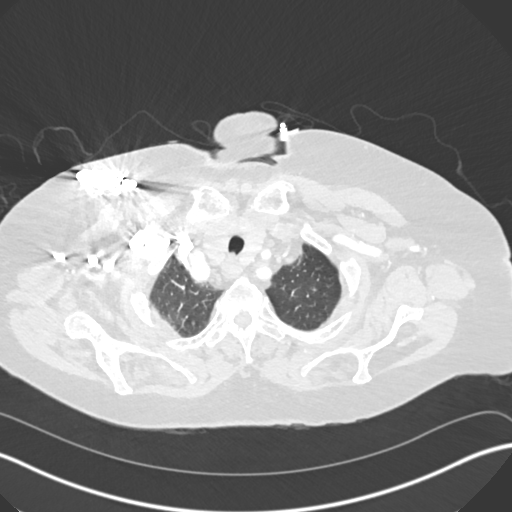

[Series 7: coronals · coronal · 0.85mm/px · 3 of 135 slices shown]
[im 34/135  soft-tissue]
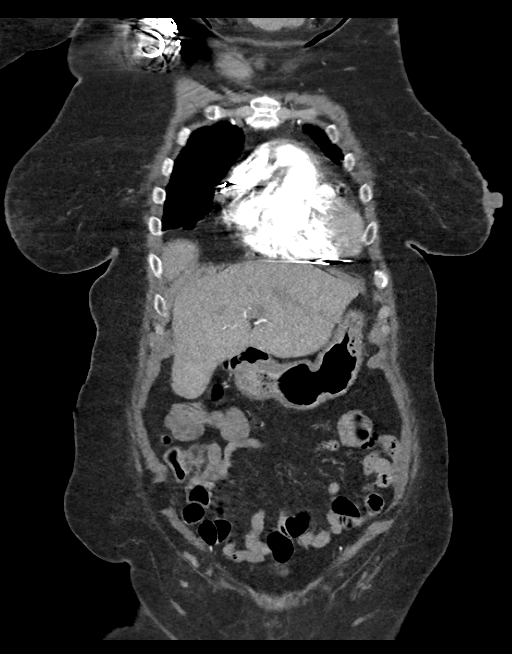
[im 68/135  soft-tissue]
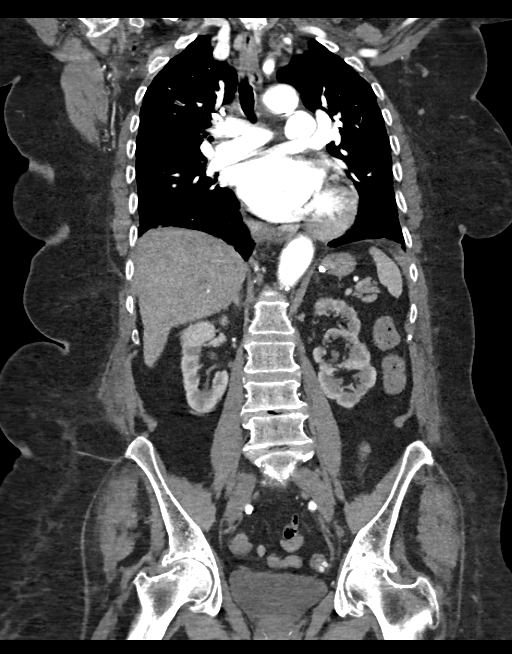
[im 101/135  soft-tissue]
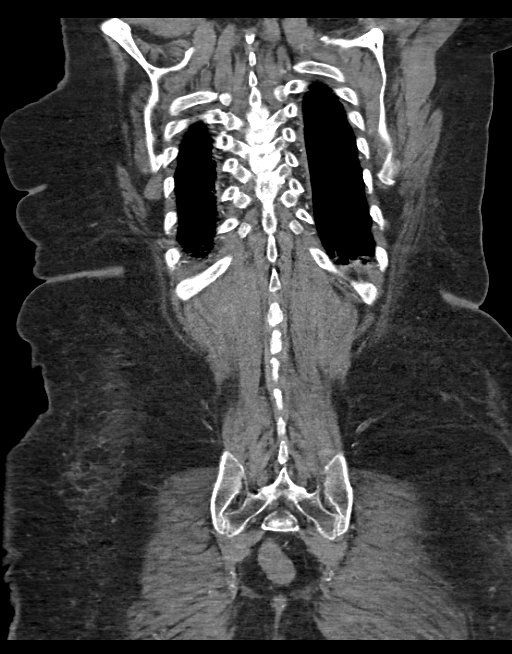

[18 of 46 positions shown; findings below may reference images not displayed]

FINDINGS: CTA CHEST FINDINGS

The thoracic aorta is of normal caliber and demonstrates no evidence
of dissection. Proximal great vessels show normal patency and
branching pattern.

The heart is mildly enlarged. Underlying pacemaker is identified.
Focally calcified plaque is identified in the distribution of the
distal LAD. No pleural or pericardial fluid is identified.

Central pulmonary arteries are prominent in caliber. There is no
evidence of pulmonary embolism. Lungs show scattered areas of
scarring bilaterally without evidence of edema, infiltrate or
nodule. No enlarged lymph nodes are identified.

Review of the MIP images confirms the above findings.

CTA ABDOMEN AND PELVIS FINDINGS

The abdominal aorta is of normal caliber and shows no evidence of
aneurysm or dissection. Iliac and common femoral arteries are
normally patent bilaterally. Visceral branch vessels show no
significant stenoses. The right renal artery demonstrates early
bifurcation. The left kidney is supplied by 2 separate arteries
emanating off of the abdominal aorta.

Solid organs of the abdomen show no significant abnormalities. There
is cortical thinning of the left kidney. No abnormal fluid
collections are identified. The gallbladder has been removed. No
masses or enlarged lymph nodes are seen. No hernias are identified.
The bladder is unremarkable. The uterus has been removed.
Degenerative changes are present in the lumbar spine.

Review of the MIP images confirms the above findings.
IMPRESSION: No evidence of aneurysmal disease or dissection in the chest,
abdomen or pelvis. Focal calcified plaque is identified in the
distribution of the distal LAD. Prominence of central pulmonary
arteries may reflect pulmonary hypertension.

## 2018-10-10 ENCOUNTER — Encounter (HOSPITAL_BASED_OUTPATIENT_CLINIC_OR_DEPARTMENT_OTHER): Payer: Self-pay | Admitting: Emergency Medicine

## 2018-10-10 ENCOUNTER — Emergency Department (HOSPITAL_BASED_OUTPATIENT_CLINIC_OR_DEPARTMENT_OTHER): Payer: Medicare Other

## 2018-10-10 ENCOUNTER — Other Ambulatory Visit: Payer: Self-pay

## 2018-10-10 ENCOUNTER — Emergency Department (HOSPITAL_BASED_OUTPATIENT_CLINIC_OR_DEPARTMENT_OTHER)
Admission: EM | Admit: 2018-10-10 | Discharge: 2018-10-10 | Disposition: A | Discharge status: Home / Self Care | Payer: Medicare Other | Attending: Emergency Medicine | Admitting: Emergency Medicine

## 2018-10-10 DIAGNOSIS — I129 Hypertensive chronic kidney disease with stage 1 through stage 4 chronic kidney disease, or unspecified chronic kidney disease: Secondary | ICD-10-CM | POA: Diagnosis not present

## 2018-10-10 DIAGNOSIS — I259 Chronic ischemic heart disease, unspecified: Secondary | ICD-10-CM | POA: Diagnosis not present

## 2018-10-10 DIAGNOSIS — R079 Chest pain, unspecified: Secondary | ICD-10-CM | POA: Diagnosis present

## 2018-10-10 DIAGNOSIS — N183 Chronic kidney disease, stage 3 (moderate): Secondary | ICD-10-CM | POA: Diagnosis not present

## 2018-10-10 DIAGNOSIS — R0789 Other chest pain: Secondary | ICD-10-CM | POA: Diagnosis not present

## 2018-10-10 LAB — BASIC METABOLIC PANEL
Anion gap: 6 (ref 5–15)
BUN: 39 mg/dL — ABNORMAL HIGH (ref 8–23)
CO2: 23 mmol/L (ref 22–32)
Calcium: 9.1 mg/dL (ref 8.9–10.3)
Chloride: 108 mmol/L (ref 98–111)
Creatinine, Ser: 1.83 mg/dL — ABNORMAL HIGH (ref 0.44–1.00)
GFR calc Af Amer: 30 mL/min — ABNORMAL LOW (ref >60)
GFR calc non Af Amer: 26 mL/min — ABNORMAL LOW (ref >60)
Glucose, Bld: 100 mg/dL — ABNORMAL HIGH (ref 70–99)
Potassium: 4.1 mmol/L (ref 3.5–5.1)
Sodium: 137 mmol/L (ref 135–145)

## 2018-10-10 LAB — CBC
HCT: 31.3 % — ABNORMAL LOW (ref 36.0–46.0)
Hemoglobin: 9.6 g/dL — ABNORMAL LOW (ref 12.0–15.0)
MCH: 29.5 pg (ref 26.0–34.0)
MCHC: 30.7 g/dL (ref 30.0–36.0)
MCV: 96.3 fL (ref 80.0–100.0)
PLATELETS: 156 10*3/uL (ref 150–400)
RBC: 3.25 MIL/uL — ABNORMAL LOW (ref 3.87–5.11)
RDW: 13.9 % (ref 11.5–15.5)
WBC: 7.6 10*3/uL (ref 4.0–10.5)
nRBC: 0 % (ref 0.0–0.2)

## 2018-10-10 LAB — TROPONIN I: Troponin I: <0.03 ng/mL (ref <0.03)

## 2018-10-10 MED ORDER — ACETAMINOPHEN 500 MG PO TABS
1000.0000 mg | ORAL_TABLET | Freq: Once | ORAL | Status: AC
  Administered 2018-10-10: 1000 mg via ORAL
  Filled 2018-10-10: qty 2

## 2018-10-10 MED ORDER — LIDOCAINE 5 % EX OINT: 1.0000 "application " | TOPICAL_OINTMENT | Freq: Three times a day (TID) | CUTANEOUS | 0 refills | Status: DC | PRN

## 2018-10-10 MED ORDER — SODIUM CHLORIDE 0.9% FLUSH
3.0000 mL | Freq: Once | INTRAVENOUS | Status: DC
  Filled 2018-10-10: qty 3

## 2018-10-10 NOTE — ED Triage Notes (Signed)
Pt having right sided rib pain, right arm pain for 2 days.  Pt states she has been sob, with chills.  Some nausea last night.  Some cough and congestion.

## 2018-10-10 NOTE — ED Notes (Signed)
ED Provider at bedside. 

## 2018-10-10 NOTE — ED Provider Notes (Signed)
Emergency Department Provider Note   I have reviewed the triage vital signs and the nursing notes.   HISTORY  Chief Complaint Chest Pain   HPI [name] is a 83 y.o. female with PMH of CAD, HTN, renal insufficieny, and gout presents to the emergency department for evaluation of right-sided chest pain radiating around to the back on the right side.  Symptoms have been constant over the past 1.5 days.  Patient has not experienced a rash.  No injury.  She does have cough and congestion symptoms.  No shortness of breath or wheezing.  She denies history of similar pain in the past.  No radiation to the abdomen, vomiting, or diarrhea. Pain is worse with movement, palpation, or deep breathing.  Patient also describes some left arm heaviness and left shoulder pain.  She states these symptoms are similar to her arthritis pain not significantly new or different.   Past Medical History:  Diagnosis Date  . Arthritis   . Bradycardia   . Coronary artery disease   . Gout   . Hypertension   . Leg weakness, bilateral   . Pacemaker   . Renal disorder    kidney stones  . Renal insufficiency     Patient Active Problem List   Diagnosis Date Noted  . CKD (chronic kidney disease) stage 3, GFR 30-59 ml/min (HCC) 10/17/2013  . Elevated liver enzymes 10/16/2013  . Abdominal pain, epigastric 10/16/2013  . Acute on chronic renal insufficiency 10/16/2013  . Chest pain 10/15/2013  . CAD (coronary artery disease) 10/15/2013  . Hypertension 10/15/2013  . Atrial fibrillation ([place]) 10/15/2013  . Pacemaker     Past Surgical History:  Procedure Laterality Date  . ABDOMINAL HYSTERECTOMY    . arm surgery    . JOINT REPLACEMENT     knee replacement  . PACEMAKER INSERTION    . REPLACEMENT TOTAL KNEE BILATERAL    . SHOULDER SURGERY    . tummy tuck     Allergies Amoxicillin and Toprol xl [metoprolol succinate]  No family history on file.  Social History Social History   Tobacco Use  .  Smoking status: Never Smoker  . Smokeless tobacco: Never Used  Substance Use Topics  . Alcohol use: Yes    Comment: occasional  . Drug use: No    Review of Systems  Constitutional: No fever/chills Eyes: No visual changes. ENT: No sore throat. Positive mild congestion.  Cardiovascular: Positive chest pain. Respiratory: Denies shortness of breath. Gastrointestinal: No abdominal pain.  No nausea, no vomiting.  No diarrhea.  No constipation. Genitourinary: Negative for dysuria. Musculoskeletal: Negative for back pain. Skin: Negative for rash. Neurological: Negative for headaches, focal weakness or numbness.  10-point ROS otherwise negative.  ____________________________________________   PHYSICAL EXAM:  VITAL SIGNS: ED Triage Vitals  Enc Vitals Group     BP 10/10/18 1036 (!) 162/65     Pulse Rate 10/10/18 1036 76     Resp --      Temp 10/10/18 1036 98 F (36.7 C)     Temp Source 10/10/18 1036 Oral     SpO2 10/10/18 1036 100 %     Weight 10/10/18 1032 182 lb (82.6 kg)     Height 10/10/18 1032 5\' 5"  (1.651 m)     Pain Score 10/10/18 1032 5   Constitutional: Alert and oriented. Well appearing and in no acute distress. Eyes: Conjunctivae are normal.  Head: Atraumatic. Nose: No congestion/rhinnorhea. Mouth/Throat: Mucous membranes are moist.  Neck: No  stridor.  Cardiovascular: Normal rate, regular rhythm. Good peripheral circulation. Grossly normal heart sounds.   Respiratory: Normal respiratory effort.  No retractions. Lungs CTAB. Gastrointestinal: Soft and nontender. No distention.  Musculoskeletal: No lower extremity tenderness nor edema. No gross deformities of extremities. Tenderness along the right lateral/anterior ribs. No crepitus. No rash in this area.  Neurologic:  Normal speech and language. No gross focal neurologic deficits are appreciated.  Skin:  Skin is warm, dry and intact. No rash noted.  ____________________________________________   LABS (all labs  ordered are listed, but only abnormal results are displayed)  Labs Reviewed  CBC - Abnormal; Notable for the following components:      Result Value   RBC 3.25 (*)    Hemoglobin 9.6 (*)    HCT 31.3 (*)    All other components within normal limits  BASIC METABOLIC PANEL - Abnormal; Notable for the following components:   Glucose, Bld 100 (*)    BUN 39 (*)    Creatinine, Ser 1.83 (*)    GFR calc non Af Amer 26 (*)    GFR calc Af Amer 30 (*)    All other components within normal limits  TROPONIN I   ____________________________________________  EKG   EKG Interpretation  Date/Time:  Tuesday October 10 2018 10:36:32 EST Ventricular Rate:  80 PR Interval:    QRS Duration: 91 QT Interval:  370 QTC Calculation: 427 R Axis:   53 Text Interpretation:  Atrial-paced rhythm Similar to prior. No STEMI.  Confirmed by Nanda Quinton [phone]) on 10/10/2018 10:39:19 AM Also confirmed by Nanda Quinton [phone]), editor Philomena Doheny [phone])  on 10/10/2018 11:57:31 AM       ____________________________________________  RADIOLOGY  Dg Chest 2 View  Result Date: 10/10/2018 CLINICAL DATA:  83 year old female with left chest pain, pain under the left breast for 2 days. EXAM: CHEST - 2 VIEW COMPARISON:  Chest radiographs 09/26/2017 and earlier. FINDINGS: Stable mild-to-moderate cardiomegaly. Other mediastinal contours are within normal limits. Stable right chest cardiac pacemaker. Visualized tracheal air column is within normal limits. Stable lung volumes with no pneumothorax, pulmonary edema, pleural effusion or confluent pulmonary opacity. Postoperative changes to the bilateral upper quadrants of the abdomen. Negative visible bowel gas pattern. No acute osseous abnormality identified. IMPRESSION: Stable cardiomegaly.  No acute radiographic finding. Electronically Signed   By: Genevie Ann M.D.   On: 10/10/2018 11:46    ____________________________________________   PROCEDURES  Procedure(s) performed:    Procedures  None ____________________________________________   INITIAL IMPRESSION / ASSESSMENT AND PLAN / ED COURSE  Pertinent labs & imaging results that were available during my care of the patient were reviewed by me and considered in my medical decision making (see chart for details).  Patient presents to the emergency department for evaluation of right-sided chest pain.  She has tenderness to palpation along the ribs.  Suspect musculoskeletal etiology but patient does have elevated risk for CAD.  Very low suspicion for PE.  Plan for chest x-ray, screening labs, reassess.  No rash to suspect developing zoster.  12:33 PM X-ray and lab work reviewed with no acute findings.  Patient with baseline CKD.  Plan for topical pain medication for likely MSK etiology.  Family asking about stronger pain medication but given the patient's age I discussed that I did not want to cause constipation, lightheadedness, increased fall risk.  Plan for Tylenol, heat application, lidocaine ointment and reassess.  She has a follow-up appointment with her pulmonologist on Friday and will discuss  her pain symptoms then if it continues. Discussed ED return precautions. ____________________________________________  FINAL CLINICAL IMPRESSION(S) / ED DIAGNOSES  Final diagnoses:  Chest wall pain     MEDICATIONS GIVEN DURING THIS VISIT:  Medications  sodium chloride flush (NS) 0.9 % injection 3 mL (has no administration in time range)  acetaminophen (TYLENOL) tablet 1,000 mg (1,000 mg Oral Given 10/10/18 1119)     NEW OUTPATIENT MEDICATIONS STARTED DURING THIS VISIT:  New Prescriptions   LIDOCAINE (XYLOCAINE) 5 % OINTMENT    Apply 1 application topically 3 (three) times daily as needed.    Note:  This document was prepared using Dragon voice recognition software and may include unintentional dictation errors.  Nanda Quinton, MD Emergency Medicine    Long, Wonda Olds, MD 10/10/18 [phone]

## 2018-10-10 NOTE — Discharge Instructions (Signed)

## 2019-03-12 ENCOUNTER — Emergency Department (HOSPITAL_BASED_OUTPATIENT_CLINIC_OR_DEPARTMENT_OTHER)
Admission: EM | Admit: 2019-03-12 | Discharge: 2019-03-12 | Disposition: A | Discharge status: Home / Self Care | Payer: Medicare Other | Attending: Emergency Medicine | Admitting: Emergency Medicine

## 2019-03-12 ENCOUNTER — Other Ambulatory Visit: Payer: Self-pay

## 2019-03-12 ENCOUNTER — Encounter (HOSPITAL_BASED_OUTPATIENT_CLINIC_OR_DEPARTMENT_OTHER): Payer: Self-pay

## 2019-03-12 ENCOUNTER — Emergency Department (HOSPITAL_BASED_OUTPATIENT_CLINIC_OR_DEPARTMENT_OTHER): Payer: Medicare Other

## 2019-03-12 DIAGNOSIS — R0602 Shortness of breath: Secondary | ICD-10-CM | POA: Diagnosis present

## 2019-03-12 DIAGNOSIS — N183 Chronic kidney disease, stage 3 (moderate): Secondary | ICD-10-CM | POA: Insufficient documentation

## 2019-03-12 DIAGNOSIS — R509 Fever, unspecified: Secondary | ICD-10-CM | POA: Insufficient documentation

## 2019-03-12 DIAGNOSIS — Z96653 Presence of artificial knee joint, bilateral: Secondary | ICD-10-CM | POA: Diagnosis not present

## 2019-03-12 DIAGNOSIS — Z20828 Contact with and (suspected) exposure to other viral communicable diseases: Secondary | ICD-10-CM | POA: Insufficient documentation

## 2019-03-12 DIAGNOSIS — I2510 Atherosclerotic heart disease of native coronary artery without angina pectoris: Secondary | ICD-10-CM | POA: Diagnosis not present

## 2019-03-12 DIAGNOSIS — I129 Hypertensive chronic kidney disease with stage 1 through stage 4 chronic kidney disease, or unspecified chronic kidney disease: Secondary | ICD-10-CM | POA: Diagnosis not present

## 2019-03-12 DIAGNOSIS — Z79899 Other long term (current) drug therapy: Secondary | ICD-10-CM | POA: Diagnosis not present

## 2019-03-12 DIAGNOSIS — Z950 Presence of cardiac pacemaker: Secondary | ICD-10-CM | POA: Insufficient documentation

## 2019-03-12 DIAGNOSIS — Z7901 Long term (current) use of anticoagulants: Secondary | ICD-10-CM | POA: Insufficient documentation

## 2019-03-12 LAB — CBC WITH DIFFERENTIAL/PLATELET
Abs Immature Granulocytes: 0.03 10*3/uL (ref 0.00–0.07)
Basophils Absolute: 0 10*3/uL (ref 0.0–0.1)
Basophils Relative: 0 %
Eosinophils Absolute: 0 10*3/uL (ref 0.0–0.5)
Eosinophils Relative: 0 %
HCT: 29.7 % — ABNORMAL LOW (ref 36.0–46.0)
Hemoglobin: 9.4 g/dL — ABNORMAL LOW (ref 12.0–15.0)
Immature Granulocytes: 0 %
Lymphocytes Relative: 5 %
Lymphs Abs: 0.6 10*3/uL — ABNORMAL LOW (ref 0.7–4.0)
MCH: 30.2 pg (ref 26.0–34.0)
MCHC: 31.6 g/dL (ref 30.0–36.0)
MCV: 95.5 fL (ref 80.0–100.0)
Monocytes Absolute: 0.8 10*3/uL (ref 0.1–1.0)
Monocytes Relative: 8 %
Neutro Abs: 8.7 10*3/uL — ABNORMAL HIGH (ref 1.7–7.7)
Neutrophils Relative %: 87 %
Platelets: 136 10*3/uL — ABNORMAL LOW (ref 150–400)
RBC: 3.11 MIL/uL — ABNORMAL LOW (ref 3.87–5.11)
RDW: 14 % (ref 11.5–15.5)
WBC: 10.1 10*3/uL (ref 4.0–10.5)
nRBC: 0 % (ref 0.0–0.2)

## 2019-03-12 LAB — URINALYSIS, ROUTINE W REFLEX MICROSCOPIC
Bilirubin Urine: NEGATIVE
Glucose, UA: NEGATIVE mg/dL
Hgb urine dipstick: NEGATIVE
Ketones, ur: NEGATIVE mg/dL
Leukocytes,Ua: NEGATIVE
Nitrite: NEGATIVE
Protein, ur: NEGATIVE mg/dL
Specific Gravity, Urine: 1.01 (ref 1.005–1.030)
pH: 8.5 — ABNORMAL HIGH (ref 5.0–8.0)

## 2019-03-12 LAB — BASIC METABOLIC PANEL
Anion gap: 7 (ref 5–15)
BUN: 39 mg/dL — ABNORMAL HIGH (ref 8–23)
CO2: 23 mmol/L (ref 22–32)
Calcium: 8.9 mg/dL (ref 8.9–10.3)
Chloride: 106 mmol/L (ref 98–111)
Creatinine, Ser: 2.64 mg/dL — ABNORMAL HIGH (ref 0.44–1.00)
GFR calc Af Amer: 19 mL/min — ABNORMAL LOW (ref >60)
GFR calc non Af Amer: 16 mL/min — ABNORMAL LOW (ref >60)
Glucose, Bld: 116 mg/dL — ABNORMAL HIGH (ref 70–99)
Potassium: 4.7 mmol/L (ref 3.5–5.1)
Sodium: 136 mmol/L (ref 135–145)

## 2019-03-12 LAB — LACTIC ACID, PLASMA: Lactic Acid, Venous: 1 mmol/L (ref 0.5–1.9)

## 2019-03-12 LAB — SARS CORONAVIRUS 2 AG (30 MIN TAT): SARS Coronavirus 2 Ag: NEGATIVE

## 2019-03-12 MED ORDER — ACETAMINOPHEN 325 MG PO TABS
650.0000 mg | ORAL_TABLET | Freq: Once | ORAL | Status: AC
  Administered 2019-03-12: 650 mg via ORAL
  Filled 2019-03-12: qty 2

## 2019-03-12 MED ORDER — LACTATED RINGERS IV BOLUS
1000.0000 mL | Freq: Once | INTRAVENOUS | Status: AC
  Administered 2019-03-12: 1000 mL via INTRAVENOUS

## 2019-03-12 NOTE — ED Notes (Signed)
Portable toilet in room.

## 2019-03-12 NOTE — ED Provider Notes (Signed)
[facility] EMERGENCY DEPARTMENT Provider Note   CSN: [id number] Arrival date & time: 03/12/19  1134     History   Chief Complaint Chief Complaint  Patient presents with  . Shortness of Breath    HPI [name] is a 83 y.o. female.     HPI   83yF with fever and fatigue. Gradual onset 3-4 days ago. Persistent since. Feels like her energy level is less. Chills at times. No cough. No dyspnea. No urinary complaints. No v/d. No sick contacts that she is aware of.   Past Medical History:  Diagnosis Date  . Arthritis   . Bradycardia   . Coronary artery disease   . Gout   . Hypertension   . Leg weakness, bilateral   . Pacemaker   . Renal disorder    kidney stones  . Renal insufficiency     Patient Active Problem List   Diagnosis Date Noted  . CKD (chronic kidney disease) stage 3, GFR 30-59 ml/min (HCC) 10/17/2013  . Elevated liver enzymes 10/16/2013  . Abdominal pain, epigastric 10/16/2013  . Acute on chronic renal insufficiency 10/16/2013  . Chest pain 10/15/2013  . CAD (coronary artery disease) 10/15/2013  . Hypertension 10/15/2013  . Atrial fibrillation ([place]) 10/15/2013  . Pacemaker     Past Surgical History:  Procedure Laterality Date  . ABDOMINAL HYSTERECTOMY    . APPENDECTOMY    . arm surgery    . JOINT REPLACEMENT     knee replacement  . PACEMAKER INSERTION    . REPLACEMENT TOTAL KNEE BILATERAL    . SHOULDER SURGERY    . tummy tuck      OB History   No obstetric history on file.    Home Medications    Prior to Admission medications   Medication Sig Start Date End Date Taking? Authorizing Provider  amiodarone (PACERONE) 200 MG tablet Take 200 mg by mouth daily.    [provider]  aspirin EC 81 MG tablet Take 81 mg by mouth every morning.    [provider]  cholecalciferol (VITAMIN D) 1000 UNITS tablet Take 2,000 Units by mouth every morning.     [provider]  febuxostat (ULORIC) 40 MG tablet Take 40 mg  by mouth every morning.     [provider]  fish oil-omega-3 fatty acids 1000 MG capsule Take 2 g by mouth every morning.     [provider]  folic acid (FOLVITE) 1 MG tablet Take 1 mg by mouth every morning.     [provider]  hydrALAZINE (APRESOLINE) 50 MG tablet Take 50 mg by mouth 3 (three) times daily.    [provider]  lidocaine (XYLOCAINE) 5 % ointment Apply 1 application topically 3 (three) times daily as needed. 10/10/18   Long, Wonda Olds, MD  methocarbamol (ROBAXIN) 500 MG tablet Take 1 tablet (500 mg total) by mouth every 8 (eight) hours as needed (muscle soreness). 09/09/13   Rolland Porter, MD  nebivolol (BYSTOLIC) 10 MG tablet Take 10 mg by mouth every morning.     [provider]  omeprazole (PRILOSEC) 40 MG capsule Take 40 mg by mouth every morning.    [provider]  pravastatin (PRAVACHOL) 10 MG tablet Take 10 mg by mouth at bedtime.    [provider]  telmisartan (MICARDIS) 40 MG tablet Take 40 mg by mouth every morning.    [provider]  torsemide (DEMADEX) 10 MG tablet Take 10 mg by  mouth daily.    [provider]  warfarin (COUMADIN) 2.5 MG tablet Take 1.25-2.5 mg by mouth daily. Take 2.5mg  (whole tablet) every other day while alternating with 1.25mg  (1/2 tablets)    [provider]   Family History No family history on file.  Social History Social History   Tobacco Use  . Smoking status: Never Smoker  . Smokeless tobacco: Never Used  Substance Use Topics  . Alcohol use: Not Currently  . Drug use: No   Allergies   Amoxicillin and Toprol xl [metoprolol succinate]  Review of Systems Review of Systems  All systems reviewed and negative, other than as noted in HPI.  Physical Exam Updated Vital Signs BP 134/66 (BP Location: Right Arm)   Pulse 73   Temp (!) 101.4 F (38.6 C) (Oral)   Resp 18   Ht 5\' 5"  (1.651 m)   Wt 80.7 kg   SpO2 98%   BMI 29.62 kg/m    Physical Exam Vitals signs and nursing note reviewed.  Constitutional:      General: She is not in acute distress.    Appearance: She is well-developed.  HENT:     Head: Normocephalic and atraumatic.  Eyes:     General:        Right eye: No discharge.        Left eye: No discharge.     Conjunctiva/sclera: Conjunctivae normal.  Neck:     Musculoskeletal: Neck supple.  Cardiovascular:     Rate and Rhythm: Normal rate and regular rhythm.     Heart sounds: Normal heart sounds. No murmur. No friction rub. No gallop.   Pulmonary:     Effort: Pulmonary effort is normal. No respiratory distress.     Breath sounds: Normal breath sounds.  Abdominal:     General: There is no distension.     Palpations: Abdomen is soft.     Tenderness: There is no abdominal tenderness.  Musculoskeletal:        General: No tenderness.  Skin:    General: Skin is warm and dry.  Neurological:     Mental Status: She is alert.  Psychiatric:        Behavior: Behavior normal.        Thought Content: Thought content normal.    ED Treatments / Results  Labs (all labs ordered are listed, but only abnormal results are displayed) Labs Reviewed  URINE CULTURE - Abnormal; Notable for the following components:      Result Value   Culture MULTIPLE SPECIES PRESENT, SUGGEST RECOLLECTION (*)    All other components within normal limits  CBC WITH DIFFERENTIAL/PLATELET - Abnormal; Notable for the following components:   RBC 3.11 (*)    Hemoglobin 9.4 (*)    HCT 29.7 (*)    Platelets 136 (*)    Neutro Abs 8.7 (*)    Lymphs Abs 0.6 (*)    All other components within normal limits  BASIC METABOLIC PANEL - Abnormal; Notable for the following components:   Glucose, Bld 116 (*)    BUN 39 (*)    Creatinine, Ser 2.64 (*)    GFR calc non Af Amer 16 (*)    GFR calc Af Amer 19 (*)    All other components within normal limits  URINALYSIS, ROUTINE W REFLEX MICROSCOPIC - Abnormal; Notable for the following components:    pH 8.5 (*)    All other components within normal limits  CULTURE, BLOOD (ROUTINE X 2)  CULTURE,  BLOOD (ROUTINE X 2)  SARS CORONAVIRUS 2 (HOSP ORDER, PERFORMED IN [facility] VIA ABBOTT ID)  LACTIC ACID, PLASMA   EKG   Radiology No results found.   Dg Chest Portable 1 View  Result Date: 03/12/2019 CLINICAL DATA:  Shortness of breath. EXAM: PORTABLE CHEST 1 VIEW COMPARISON:  Radiographs of October 10, 2018. FINDINGS: Stable cardiomegaly. Atherosclerosis of thoracic aorta is noted. Stable right-sided Port-A-Cath. Both lungs are clear. The visualized skeletal structures are unremarkable. IMPRESSION: No active disease. Electronically Signed   By: Marijo Conception M.D.   On: 03/12/2019 12:53    Procedures Procedures (including critical care time)  Medications Ordered in ED Medications  acetaminophen (TYLENOL) tablet 650 mg (650 mg Oral Given 03/12/19 1325)  lactated ringers bolus 1,000 mL (0 mLs Intravenous Stopped 03/12/19 1548)   Initial Impression / Assessment and Plan / ED Course  I have reviewed the triage vital signs and the nursing notes.  Pertinent labs & imaging results that were available during my care of the patient were reviewed by me and considered in my medical decision making (see chart for details).  83yF with fever and some mild fatigue. She really has little in way of additional complaints. Viral syndrome. Nontoxic. No obvious source on ED w/u. Plan symptomatic tx at this time. Advised to stay well hydrated. Strict return precautions discussed. Outpt FU otherwise.   LANDRI DORSAINVIL was evaluated in Emergency Department on 03/12/2019 for the symptoms described in the history of present illness. She was evaluated in the context of the global COVID-19 pandemic, which necessitated consideration that the patient might be at risk for infection with the SARS-CoV-2 virus that causes COVID-19. Institutional protocols and algorithms that pertain to the evaluation of patients at risk  for COVID-19 are in a state of rapid change based on information released by regulatory bodies including the CDC and federal and state organizations. These policies and algorithms were followed during the patient's care in the ED.   Final Clinical Impressions(s) / ED Diagnoses   Final diagnoses:  Febrile illness    ED Discharge Orders    None       Virgel Manifold, MD 03/15/19 [id number]

## 2019-03-12 NOTE — ED Triage Notes (Signed)
Pt c/o SOB, weakness x 4 days-sates she was seen by PCP Friday-lab drawn no results-NAD-to triage in w/c

## 2019-03-13 LAB — URINE CULTURE

## 2019-03-15 NOTE — ED Notes (Signed)
Lab call w aerobic gram pos rods in 1 bottle per md called pt,  Pt states no fever and she is feeling good,  Informed pt if fever returns to follow up w pcp or return to ed  0735,  03/15/19  s Myliyah Rebuck rn

## 2019-03-18 LAB — CULTURE, BLOOD (ROUTINE X 2)
Special Requests: ADEQUATE
Special Requests: ADEQUATE

## 2019-03-19 ENCOUNTER — Telehealth: Payer: Self-pay | Admitting: Emergency Medicine

## 2019-03-19 NOTE — Telephone Encounter (Signed)
Post ED Visit - Positive Culture Follow-up  Culture report reviewed by antimicrobial stewardship pharmacist: [facility] Team []  Elenor Quinones, Pharm.D. []  Heide Guile, Pharm.D., BCPS AQ-ID []  Parks Neptune, Pharm.D., BCPS []  Alycia Rossetti, Pharm.D., BCPS []  [place], Pharm.D., BCPS, AAHIVP []  Legrand Como, Pharm.D., BCPS, AAHIVP []  Salome Arnt, PharmD, BCPS []  Johnnette Gourd, PharmD, BCPS []  Hughes Better, PharmD, BCPS []  Leeroy Cha, PharmD []  Laqueta Linden, PharmD, BCPS []  Albertina Parr, PharmD Elicia Lamp PharmD  [facility] Team []  Leodis Sias, PharmD []  Lindell Spar, PharmD []  Royetta Asal, PharmD []  Graylin Shiver, Rph []  Rema Fendt) Glennon Mac, PharmD []  Arlyn Dunning, PharmD []  Netta Cedars, PharmD []  Dia Sitter, PharmD []  Leone Haven, PharmD []  Gretta Arab, PharmD []  Theodis Shove, PharmD []  Peggyann Juba, PharmD []  Reuel Boom, PharmD   Positive blood culture Patient already called and informed to return if any fevers or symptoms, no further patient follow-up is required at this time.  Hazle Nordmann 03/19/2019, 10:33 AM

## 2020-06-04 ENCOUNTER — Emergency Department (HOSPITAL_BASED_OUTPATIENT_CLINIC_OR_DEPARTMENT_OTHER): Payer: Medicare Other

## 2020-06-04 ENCOUNTER — Encounter (HOSPITAL_BASED_OUTPATIENT_CLINIC_OR_DEPARTMENT_OTHER): Payer: Self-pay

## 2020-06-04 ENCOUNTER — Emergency Department (HOSPITAL_BASED_OUTPATIENT_CLINIC_OR_DEPARTMENT_OTHER)
Admission: EM | Admit: 2020-06-04 | Discharge: 2020-06-04 | Disposition: A | Discharge status: Home / Self Care | Payer: Medicare Other | Attending: Emergency Medicine | Admitting: Emergency Medicine

## 2020-06-04 ENCOUNTER — Other Ambulatory Visit: Payer: Self-pay

## 2020-06-04 DIAGNOSIS — Z20822 Contact with and (suspected) exposure to covid-19: Secondary | ICD-10-CM | POA: Insufficient documentation

## 2020-06-04 DIAGNOSIS — Z79899 Other long term (current) drug therapy: Secondary | ICD-10-CM | POA: Diagnosis not present

## 2020-06-04 DIAGNOSIS — Z96653 Presence of artificial knee joint, bilateral: Secondary | ICD-10-CM | POA: Insufficient documentation

## 2020-06-04 DIAGNOSIS — J069 Acute upper respiratory infection, unspecified: Secondary | ICD-10-CM | POA: Diagnosis not present

## 2020-06-04 DIAGNOSIS — R600 Localized edema: Secondary | ICD-10-CM | POA: Insufficient documentation

## 2020-06-04 DIAGNOSIS — Z7901 Long term (current) use of anticoagulants: Secondary | ICD-10-CM | POA: Diagnosis not present

## 2020-06-04 DIAGNOSIS — I129 Hypertensive chronic kidney disease with stage 1 through stage 4 chronic kidney disease, or unspecified chronic kidney disease: Secondary | ICD-10-CM | POA: Diagnosis not present

## 2020-06-04 DIAGNOSIS — I2510 Atherosclerotic heart disease of native coronary artery without angina pectoris: Secondary | ICD-10-CM | POA: Insufficient documentation

## 2020-06-04 DIAGNOSIS — N183 Chronic kidney disease, stage 3 unspecified: Secondary | ICD-10-CM | POA: Insufficient documentation

## 2020-06-04 DIAGNOSIS — R05 Cough: Secondary | ICD-10-CM | POA: Diagnosis present

## 2020-06-04 LAB — CBC WITH DIFFERENTIAL/PLATELET
Abs Immature Granulocytes: 0.06 10*3/uL (ref 0.00–0.07)
Basophils Absolute: 0 10*3/uL (ref 0.0–0.1)
Basophils Relative: 0 %
Eosinophils Absolute: 0.1 10*3/uL (ref 0.0–0.5)
Eosinophils Relative: 1 %
HCT: 30.1 % — ABNORMAL LOW (ref 36.0–46.0)
Hemoglobin: 9.3 g/dL — ABNORMAL LOW (ref 12.0–15.0)
Immature Granulocytes: 1 %
Lymphocytes Relative: 11 %
Lymphs Abs: 1 10*3/uL (ref 0.7–4.0)
MCH: 28.5 pg (ref 26.0–34.0)
MCHC: 30.9 g/dL (ref 30.0–36.0)
MCV: 92.3 fL (ref 80.0–100.0)
Monocytes Absolute: 1 10*3/uL (ref 0.1–1.0)
Monocytes Relative: 11 %
Neutro Abs: 6.5 10*3/uL (ref 1.7–7.7)
Neutrophils Relative %: 76 %
Platelets: 188 10*3/uL (ref 150–400)
RBC: 3.26 MIL/uL — ABNORMAL LOW (ref 3.87–5.11)
RDW: 15.9 % — ABNORMAL HIGH (ref 11.5–15.5)
WBC: 8.6 10*3/uL (ref 4.0–10.5)
nRBC: 0 % (ref 0.0–0.2)

## 2020-06-04 LAB — BASIC METABOLIC PANEL
Anion gap: 7 (ref 5–15)
BUN: 37 mg/dL — ABNORMAL HIGH (ref 8–23)
CO2: 20 mmol/L — ABNORMAL LOW (ref 22–32)
Calcium: 8.9 mg/dL (ref 8.9–10.3)
Chloride: 106 mmol/L (ref 98–111)
Creatinine, Ser: 2.46 mg/dL — ABNORMAL HIGH (ref 0.44–1.00)
GFR calc Af Amer: 20 mL/min — ABNORMAL LOW (ref >60)
GFR calc non Af Amer: 18 mL/min — ABNORMAL LOW (ref >60)
Glucose, Bld: 100 mg/dL — ABNORMAL HIGH (ref 70–99)
Potassium: 4.7 mmol/L (ref 3.5–5.1)
Sodium: 133 mmol/L — ABNORMAL LOW (ref 135–145)

## 2020-06-04 LAB — SARS CORONAVIRUS 2 BY RT PCR (HOSPITAL ORDER, PERFORMED IN ~~LOC~~ HOSPITAL LAB): SARS Coronavirus 2: NEGATIVE

## 2020-06-04 LAB — BRAIN NATRIURETIC PEPTIDE: B Natriuretic Peptide: 374.5 pg/mL — ABNORMAL HIGH (ref 0.0–100.0)

## 2020-06-04 MED ORDER — HYDROCOD POLST-CPM POLST ER 10-8 MG/5ML PO SUER
5.0000 mL | Freq: Two times a day (BID) | ORAL | 0 refills | Status: AC | PRN
Start: 2020-06-04 — End: 2020-06-18

## 2020-06-04 NOTE — Discharge Instructions (Addendum)
Please take the Tussionex anticough medication, as directed. Do not drive. Do not use machinery or power tools. Do not sign legal documents. Do not drink alcohol. Do not take sleeping pills. Do not supervise children by yourself. Do not participate in activities that require climbing or being in high places.  Do not take in conjunction with the previously prescribed narcotic medication. Instead, you can alternate. I would like for you to follow-up with your primary care provider and with your cardiologist, as needed/scheduled.  Your work-up today was reassuring and your physical exam was nonspecific. I suspect that you have a viral upper respiratory infection with cough which is causing your diffuse back aching discomfort that is worse with cough.  Your BNP was elevated suggesting that you might be mildly fluid overloaded. I would like to restart your diuretic medications.  Otherwise, your laboratory work-up is unremarkable. Chest x-ray did not demonstrate any evidence to suggest pneumonia.  Return to the ED or seek immediate medical attention should you experience any new or worsening symptoms.

## 2020-06-04 NOTE — ED Provider Notes (Signed)
[facility]ERGENC[facility] DEPARTMENT Provider Note   CSN: [id number] Arrival date & time: 06/04/20  1120     History Chief Complaint  Patient presents with  . Cough    [name] is a 84 y.o. female with PMH significant for HTN, HLD, HFpEF, GERD, and CAD on warfarin presents the ED with a 5-day history of nonproductive cough and generalized body aches.  Patient went to see her primary care provider, Dr. Dimas Aguas, but he was unable to see her due to her respiratory symptoms and she did not have any imaging to assess for pneumonia.  She is concerned that she has a pneumonia and requires antibiotics.  In addition to her cough, she notes significant generalized back discomfort.  No focal areas of tenderness.  She states that her pain is worse with coughing and deep inspiration.  She denies any fevers or chills, SOB, chest pain, unilateral extremity swelling or edema, history of clots or clotting disorder, hemoptysis, sore throat, abdominal pain, nausea or vomiting, recent surgery/immobilization, or other symptoms.  Patient reports that she is fully immunized for COVID-19.  HPI     Past Medical History:  Diagnosis Date  . Arthritis   . Bradycardia   . Coronary artery disease   . Gout   . Hypertension   . Leg weakness, bilateral   . Pacemaker   . Renal disorder    kidney stones  . Renal insufficiency     Patient Active Problem List   Diagnosis Date Noted  . CKD (chronic kidney disease) stage 3, GFR 30-59 ml/min 10/17/2013  . Elevated liver enzymes 10/16/2013  . Abdominal pain, epigastric 10/16/2013  . Acute on chronic renal insufficiency 10/16/2013  . Chest pain 10/15/2013  . CAD (coronary artery disease) 10/15/2013  . Hypertension 10/15/2013  . Atrial fibrillation ([place]) 10/15/2013  . Pacemaker     Past Surgical History:  Procedure Laterality Date  . ABDOMINAL HYSTERECTOMY    . APPENDECTOMY    . arm surgery    . JOINT REPLACEMENT     knee replacement  .  PACEMAKER INSERTION    . REPLACEMENT TOTAL KNEE BILATERAL    . SHOULDER SURGERY    . tummy tuck       OB History   No obstetric history on file.     No family history on file.  Social History   Tobacco Use  . Smoking status: Never Smoker  . Smokeless tobacco: Never Used  Vaping Use  . Vaping Use: Never used  Substance Use Topics  . Alcohol use: Not Currently  . Drug use: No    Home Medications Prior to Admission medications   Medication Sig Start Date End Date Taking? Authorizing Provider  amiodarone (PACERONE) 200 MG tablet Take 200 mg by mouth daily.    [provider]  aspirin EC 81 MG tablet Take 81 mg by mouth every morning.    [provider]  chlorpheniramine-HYDROcodone (TUSSIONEX PENNKINETIC ER) 10-8 MG/5ML SUER Take 5 mLs by mouth every 12 (twelve) hours as needed for up to 14 days for cough. 06/04/20 06/18/20  Corena Herter, PA-C  cholecalciferol (VITAMIN D) 1000 UNITS tablet Take 2,000 Units by mouth every morning.     [provider]  febuxostat (ULORIC) 40 MG tablet Take 40 mg by mouth every morning.     [provider]  fish oil-omega-3 fatty acids 1000 MG capsule Take 2 g by mouth every morning.     [provider]  folic acid (FOLVITE) 1 MG tablet Take 1 mg by mouth every morning.     [provider]  hydrALAZINE (APRESOLINE) 50 MG tablet Take 50 mg by mouth 3 (three) times daily.    [provider]  lidocaine (XYLOCAINE) 5 % ointment Apply 1 application topically 3 (three) times daily as needed. 10/10/18   Long, Wonda Olds, MD  methocarbamol (ROBAXIN) 500 MG tablet Take 1 tablet (500 mg total) by mouth every 8 (eight) hours as needed (muscle soreness). 09/09/13   Rolland Porter, MD  nebivolol (BYSTOLIC) 10 MG tablet Take 10 mg by mouth every morning.     [provider]  omeprazole (PRILOSEC) 40 MG capsule Take 40 mg by mouth every morning.    [provider]  pravastatin (PRAVACHOL)  10 MG tablet Take 10 mg by mouth at bedtime.    [provider]  telmisartan (MICARDIS) 40 MG tablet Take 40 mg by mouth every morning.    [provider]  torsemide (DEMADEX) 10 MG tablet Take 10 mg by mouth daily.    [provider]  warfarin (COUMADIN) 2.5 MG tablet Take 1.25-2.5 mg by mouth daily. Take 2.5mg  (whole tablet) every other day while alternating with 1.25mg  (1/2 tablets)    [provider]    Allergies    Amoxicillin and Toprol xl [metoprolol succinate]  Review of Systems   Review of Systems  All other systems reviewed and are negative.   Physical Exam Updated Vital Signs BP (!) 161/80 (BP Location: Left Arm)   Pulse 70   Temp 98.5 F (36.9 C) (Oral)   Resp 20   SpO2 100%   Physical Exam Vitals and nursing note reviewed. Exam conducted with a chaperone present.  Constitutional:      Appearance: Normal appearance.  HENT:     Head: Normocephalic and atraumatic.  Eyes:     General: No scleral icterus.    Conjunctiva/sclera: Conjunctivae normal.  Cardiovascular:     Rate and Rhythm: Normal rate and regular rhythm.     Pulses: Normal pulses.     Heart sounds: Normal heart sounds.  Pulmonary:     Comments: No significant increased work of breathing.  Breath sounds intact bilaterally.  No abnormal breath sounds.  Symmetric chest rise.  No accessory muscle use or distress noted on exam.  No tachypnea.  No hypoxia on RA.   Abdominal:     General: Abdomen is flat. There is no distension.     Palpations: Abdomen is soft.     Tenderness: There is no abdominal tenderness.  Musculoskeletal:     Cervical back: Normal range of motion. No rigidity.  Skin:    General: Skin is dry.     Capillary Refill: Capillary refill takes less than 2 seconds.  Neurological:     Mental Status: She is alert and oriented to person, place, and time.     GCS: GCS eye subscore is 4. GCS verbal subscore is 5. GCS motor subscore is 6.  Psychiatric:         Mood and Affect: Mood normal.        Behavior: Behavior normal.        Thought Content: Thought content normal.     ED Results / Procedures / Treatments   Labs (all labs ordered are listed, but only abnormal results are displayed) Labs Reviewed  CBC WITH DIFFERENTIAL/PLATELET - Abnormal; Notable for the following components:      Result Value   RBC  3.26 (*)    Hemoglobin 9.3 (*)    HCT 30.1 (*)    RDW 15.9 (*)    All other components within normal limits  BASIC METABOLIC PANEL - Abnormal; Notable for the following components:   Sodium 133 (*)    CO2 20 (*)    Glucose, Bld 100 (*)    BUN 37 (*)    Creatinine, Ser 2.46 (*)    GFR calc non Af Amer 18 (*)    GFR calc Af Amer 20 (*)    All other components within normal limits  BRAIN NATRIURETIC PEPTIDE - Abnormal; Notable for the following components:   B Natriuretic Peptide 374.5 (*)    All other components within normal limits  SARS CORONAVIRUS 2 BY RT PCR (HOSPITAL ORDER, [facility])    EKG None  Radiology DG Chest Portable 1 View  Result Date: 06/04/2020 CLINICAL DATA:  Cough EXAM: PORTABLE CHEST 1 VIEW COMPARISON:  May 07, 2020 FINDINGS: No edema or airspace opacity. Heart is mildly enlarged with pulmonary vascularity normal. Pacemaker leads are attached to the right atrium and right ventricle. There is aortic atherosclerosis. No adenopathy. No bone lesions. IMPRESSION: Mild cardiac enlargement. Pacemaker leads attached to right atrium and right ventricle. Lungs clear. Aortic Atherosclerosis (ICD10-I70.0). Electronically Signed   By: Lowella Grip III M.D.   On: 06/04/2020 14:24    Procedures Procedures (including critical care time)  Medications Ordered in ED Medications - No data to display  ED Course  I have reviewed the triage vital signs and the nursing notes.  Pertinent labs & imaging results that were available during my care of the patient were reviewed by me and considered  in my medical decision making (see chart for details).  Clinical Course as of Jun 04 1520  Wed Jun 05, 2275  [id number] 84 year old female here with cough x5 days along with body aches.  Saw her PCP currently was given some pain medication.  No known fevers.  Cough is nonproductive.  Covid vaccinated.  Labs look about baseline for her.  Waiting on Covid testing.  BNP elevated unclear baseline.  She says she is seeing her cardiologist tomorrow.   [MB]    Clinical Course User Index [MB] Hayden Rasmussen, MD   MDM Rules/Calculators/A&P                          Patient reports of generalized body aches in conjunction with cough is suggestive of viral upper respiratory infection.  Labs COVID-19 testing: Negative. CBC with differential: Mild anemia with hemoglobin of 9.3, consistent with baseline. BMP: Renal function consistent with baseline.  Mildly hyponatremic to 133, mild worsening compared to recent labs obtained 1 year ago. BNP: Mildly elevated at 374.5.  No recent labs with which to compare.  While patient does have elevated BNP here in the ED, her symptoms are more suggestive of URI.  She does have pedal edema on exam, but she notes that it is chronic.  No new changes in terms of orthopnea or dyspnea on exertion.  Her primary concerns are her cough and body aches.  Patient's CXR is negative for acute infiltrate.  Symptoms are likely of viral etiology.  Discussed that antibiotics are not indicated for viral infections.  Patient will be discharged with symptomatic treatment.  Patient is tolerating food and liquid without difficulty and I do not believe that laboratory work-up would yield any significant findings.  I emphasized the importance of rest, continued oral hydration, and antipyretics as needed for fever control.    On subsequent evaluation, patient states that she has been taking oxycodone/acetaminophen at home without much relief. She states that her back pain is worse with coughing and  moving around, suggestive of musculoskeletal etiology. Likely consistent with her URI. She adamantly denies any urinary symptoms and she has no CVA tenderness on exam. He also denies any fevers or chills at home and her vital signs are reassuring here in the ED, aside from mild hypertension. She will follow up with her cardiologist for her appointment tomorrow, as scheduled. Patient reports that she had recently been off of her diuretic medications, but yesterday during her encounter with primary care provider, they discussed restarting her diuretics. She has not since taken them. I feel as though she can restart with her normal dose. No adjustments necessary. She has been taking Tessalon Perles, without relief. Will prescribe Tussionex.    They were provided opportunity to ask any additional questions and have none at this time.  Prior to discharge patient is feeling well, agreeable with plan for discharge home.  They have expressed understanding of verbal discharge instructions as well as return precautions and are agreeable to the plan.   JANNELY HENTHORN was evaluated in Emergency Department on 06/04/2020 for the symptoms described in the history of present illness. She was evaluated in the context of the global COVID-19 pandemic, which necessitated consideration that the patient might be at risk for infection with the SARS-CoV-2 virus that causes COVID-19. Institutional protocols and algorithms that pertain to the evaluation of patients at risk for COVID-19 are in a state of rapid change based on information released by regulatory bodies including the CDC and federal and state organizations. These policies and algorithms were followed during the patient's care in the ED.   Final Clinical Impression(s) / ED Diagnoses Final diagnoses:  Viral URI with cough    Rx / DC Orders ED Discharge Orders         Ordered    chlorpheniramine-HYDROcodone (TUSSIONEX PENNKINETIC ER) 10-8 MG/5ML SUER  Every 12 hours PRN         06/04/20 1521           Corena Herter, PA-C 06/04/20 1522    Hayden Rasmussen, MD 06/04/20 1918

## 2020-06-04 NOTE — ED Triage Notes (Addendum)
Pt c/o "body sore", nonprod cough x 5 days-states she was seen by PCP 2 days ago with same c/o-states she was given "pain pill"-to triage in w/c-NAD

## 2021-02-15 ENCOUNTER — Inpatient Hospital Stay (HOSPITAL_BASED_OUTPATIENT_CLINIC_OR_DEPARTMENT_OTHER)
Admission: EM | Admit: 2021-02-15 | Discharge: 2021-02-17 | DRG: 291 | Disposition: A | Discharge status: Home Health | Payer: Medicare Other | Attending: Student | Admitting: Student

## 2021-02-15 ENCOUNTER — Emergency Department (HOSPITAL_BASED_OUTPATIENT_CLINIC_OR_DEPARTMENT_OTHER): Payer: Medicare Other

## 2021-02-15 ENCOUNTER — Encounter (HOSPITAL_BASED_OUTPATIENT_CLINIC_OR_DEPARTMENT_OTHER): Payer: Self-pay

## 2021-02-15 ENCOUNTER — Other Ambulatory Visit: Payer: Self-pay

## 2021-02-15 DIAGNOSIS — I4891 Unspecified atrial fibrillation: Secondary | ICD-10-CM | POA: Diagnosis present

## 2021-02-15 DIAGNOSIS — I480 Paroxysmal atrial fibrillation: Secondary | ICD-10-CM | POA: Diagnosis present

## 2021-02-15 DIAGNOSIS — E876 Hypokalemia: Secondary | ICD-10-CM | POA: Diagnosis not present

## 2021-02-15 DIAGNOSIS — R911 Solitary pulmonary nodule: Secondary | ICD-10-CM

## 2021-02-15 DIAGNOSIS — I495 Sick sinus syndrome: Secondary | ICD-10-CM | POA: Diagnosis not present

## 2021-02-15 DIAGNOSIS — Z950 Presence of cardiac pacemaker: Secondary | ICD-10-CM | POA: Diagnosis not present

## 2021-02-15 DIAGNOSIS — N184 Chronic kidney disease, stage 4 (severe): Secondary | ICD-10-CM | POA: Diagnosis present

## 2021-02-15 DIAGNOSIS — R1030 Lower abdominal pain, unspecified: Secondary | ICD-10-CM

## 2021-02-15 DIAGNOSIS — R918 Other nonspecific abnormal finding of lung field: Secondary | ICD-10-CM | POA: Diagnosis present

## 2021-02-15 DIAGNOSIS — Z7982 Long term (current) use of aspirin: Secondary | ICD-10-CM

## 2021-02-15 DIAGNOSIS — D649 Anemia, unspecified: Secondary | ICD-10-CM | POA: Diagnosis present

## 2021-02-15 DIAGNOSIS — I5033 Acute on chronic diastolic (congestive) heart failure: Secondary | ICD-10-CM | POA: Diagnosis not present

## 2021-02-15 DIAGNOSIS — M109 Gout, unspecified: Secondary | ICD-10-CM | POA: Diagnosis not present

## 2021-02-15 DIAGNOSIS — Z7901 Long term (current) use of anticoagulants: Secondary | ICD-10-CM

## 2021-02-15 DIAGNOSIS — E785 Hyperlipidemia, unspecified: Secondary | ICD-10-CM | POA: Diagnosis present

## 2021-02-15 DIAGNOSIS — I5031 Acute diastolic (congestive) heart failure: Secondary | ICD-10-CM | POA: Diagnosis not present

## 2021-02-15 DIAGNOSIS — Z79899 Other long term (current) drug therapy: Secondary | ICD-10-CM

## 2021-02-15 DIAGNOSIS — Z87442 Personal history of urinary calculi: Secondary | ICD-10-CM | POA: Diagnosis not present

## 2021-02-15 DIAGNOSIS — Z96653 Presence of artificial knee joint, bilateral: Secondary | ICD-10-CM | POA: Diagnosis present

## 2021-02-15 DIAGNOSIS — I130 Hypertensive heart and chronic kidney disease with heart failure and stage 1 through stage 4 chronic kidney disease, or unspecified chronic kidney disease: Principal | ICD-10-CM | POA: Diagnosis present

## 2021-02-15 DIAGNOSIS — Z880 Allergy status to penicillin: Secondary | ICD-10-CM | POA: Diagnosis not present

## 2021-02-15 DIAGNOSIS — I160 Hypertensive urgency: Secondary | ICD-10-CM | POA: Diagnosis not present

## 2021-02-15 DIAGNOSIS — Z888 Allergy status to other drugs, medicaments and biological substances status: Secondary | ICD-10-CM | POA: Diagnosis not present

## 2021-02-15 DIAGNOSIS — Z20822 Contact with and (suspected) exposure to covid-19: Secondary | ICD-10-CM | POA: Diagnosis not present

## 2021-02-15 DIAGNOSIS — R1032 Left lower quadrant pain: Secondary | ICD-10-CM | POA: Diagnosis not present

## 2021-02-15 DIAGNOSIS — I2510 Atherosclerotic heart disease of native coronary artery without angina pectoris: Secondary | ICD-10-CM | POA: Diagnosis present

## 2021-02-15 DIAGNOSIS — D631 Anemia in chronic kidney disease: Secondary | ICD-10-CM | POA: Diagnosis not present

## 2021-02-15 LAB — ABO/RH: ABO/RH(D): B POS

## 2021-02-15 LAB — CBC WITH DIFFERENTIAL/PLATELET
Abs Immature Granulocytes: 0.03 10*3/uL (ref 0.00–0.07)
Basophils Absolute: 0 10*3/uL (ref 0.0–0.1)
Basophils Relative: 0 %
Eosinophils Absolute: 0.1 10*3/uL (ref 0.0–0.5)
Eosinophils Relative: 1 %
HCT: 23.1 % — ABNORMAL LOW (ref 36.0–46.0)
Hemoglobin: 7.4 g/dL — ABNORMAL LOW (ref 12.0–15.0)
Immature Granulocytes: 0 %
Lymphocytes Relative: 10 %
Lymphs Abs: 0.7 10*3/uL (ref 0.7–4.0)
MCH: 30.1 pg (ref 26.0–34.0)
MCHC: 32 g/dL (ref 30.0–36.0)
MCV: 93.9 fL (ref 80.0–100.0)
Monocytes Absolute: 0.6 10*3/uL (ref 0.1–1.0)
Monocytes Relative: 8 %
Neutro Abs: 5.8 10*3/uL (ref 1.7–7.7)
Neutrophils Relative %: 81 %
Platelets: 155 10*3/uL (ref 150–400)
RBC: 2.46 MIL/uL — ABNORMAL LOW (ref 3.87–5.11)
RDW: 15.4 % (ref 11.5–15.5)
WBC: 7.2 10*3/uL (ref 4.0–10.5)
nRBC: 0 % (ref 0.0–0.2)

## 2021-02-15 LAB — SEDIMENTATION RATE: Sed Rate: 70 mm/hr — ABNORMAL HIGH (ref 0–22)

## 2021-02-15 LAB — IRON AND TIBC
Iron: 22 ug/dL — ABNORMAL LOW (ref 28–170)
Iron: 23 ug/dL — ABNORMAL LOW (ref 28–170)
Saturation Ratios: 10 % — ABNORMAL LOW (ref 10.4–31.8)
Saturation Ratios: 10 % — ABNORMAL LOW (ref 10.4–31.8)
TIBC: 210 ug/dL — ABNORMAL LOW (ref 250–450)
TIBC: 220 ug/dL — ABNORMAL LOW (ref 250–450)
UIBC: 188 ug/dL
UIBC: 197 ug/dL

## 2021-02-15 LAB — RETICULOCYTES
Immature Retic Fract: 8.2 % (ref 2.3–15.9)
Immature Retic Fract: 4.8 % (ref 2.3–15.9)
RBC.: 2.44 MIL/uL — ABNORMAL LOW (ref 3.87–5.11)
RBC.: 2.64 MIL/uL — ABNORMAL LOW (ref 3.87–5.11)
Retic Count, Absolute: 39.8 10*3/uL (ref 19.0–186.0)
Retic Count, Absolute: 42.5 10*3/uL (ref 19.0–186.0)
Retic Ct Pct: 1.6 % (ref 0.4–3.1)
Retic Ct Pct: 1.6 % (ref 0.4–3.1)

## 2021-02-15 LAB — URINALYSIS, MICROSCOPIC (REFLEX)

## 2021-02-15 LAB — URINALYSIS, ROUTINE W REFLEX MICROSCOPIC
Bilirubin Urine: NEGATIVE
Glucose, UA: NEGATIVE mg/dL
Hgb urine dipstick: NEGATIVE
Ketones, ur: NEGATIVE mg/dL
Leukocytes,Ua: NEGATIVE
Nitrite: NEGATIVE
Protein, ur: 30 mg/dL — AB
Specific Gravity, Urine: <1.005 — ABNORMAL LOW (ref 1.005–1.030)
pH: 6 (ref 5.0–8.0)

## 2021-02-15 LAB — PROTIME-INR
INR: 1.1 (ref 0.8–1.2)
Prothrombin Time: 14.5 seconds (ref 11.4–15.2)

## 2021-02-15 LAB — COMPREHENSIVE METABOLIC PANEL
ALT: 10 U/L (ref 0–44)
AST: 16 U/L (ref 15–41)
Albumin: 2.7 g/dL — ABNORMAL LOW (ref 3.5–5.0)
Alkaline Phosphatase: 63 U/L (ref 38–126)
Anion gap: 6 (ref 5–15)
BUN: 34 mg/dL — ABNORMAL HIGH (ref 8–23)
CO2: 27 mmol/L (ref 22–32)
Calcium: 8.5 mg/dL — ABNORMAL LOW (ref 8.9–10.3)
Chloride: 110 mmol/L (ref 98–111)
Creatinine, Ser: 2.41 mg/dL — ABNORMAL HIGH (ref 0.44–1.00)
GFR, Estimated: 19 mL/min — ABNORMAL LOW (ref >60)
Glucose, Bld: 100 mg/dL — ABNORMAL HIGH (ref 70–99)
Potassium: 3.4 mmol/L — ABNORMAL LOW (ref 3.5–5.1)
Sodium: 143 mmol/L (ref 135–145)
Total Bilirubin: 0.5 mg/dL (ref 0.3–1.2)
Total Protein: 6.3 g/dL — ABNORMAL LOW (ref 6.5–8.1)

## 2021-02-15 LAB — HEMOGLOBIN AND HEMATOCRIT, BLOOD
HCT: 24.9 % — ABNORMAL LOW (ref 36.0–46.0)
Hemoglobin: 7.9 g/dL — ABNORMAL LOW (ref 12.0–15.0)

## 2021-02-15 LAB — LIPASE, BLOOD: Lipase: 24 U/L (ref 11–51)

## 2021-02-15 LAB — FOLATE
Folate: 9 ng/mL (ref >5.9)
Folate: 10.5 ng/mL (ref >5.9)

## 2021-02-15 LAB — PREPARE RBC (CROSSMATCH)

## 2021-02-15 LAB — RESP PANEL BY RT-PCR (FLU A&B, COVID) ARPGX2
Influenza A by PCR: NEGATIVE
Influenza B by PCR: NEGATIVE
SARS Coronavirus 2 by RT PCR: NEGATIVE

## 2021-02-15 LAB — TROPONIN I (HIGH SENSITIVITY)
Troponin I (High Sensitivity): 23 ng/L — ABNORMAL HIGH (ref <18)
Troponin I (High Sensitivity): 23 ng/L — ABNORMAL HIGH (ref <18)

## 2021-02-15 LAB — FERRITIN
Ferritin: 131 ng/mL (ref 11–307)
Ferritin: 129 ng/mL (ref 11–307)

## 2021-02-15 LAB — VITAMIN B12
Vitamin B-12: 216 pg/mL (ref 180–914)
Vitamin B-12: 236 pg/mL (ref 180–914)

## 2021-02-15 LAB — BRAIN NATRIURETIC PEPTIDE: B Natriuretic Peptide: 750.9 pg/mL — ABNORMAL HIGH (ref 0.0–100.0)

## 2021-02-15 LAB — C-REACTIVE PROTEIN: CRP: 8.3 mg/dL — ABNORMAL HIGH (ref <1.0)

## 2021-02-15 MED ORDER — ACETAMINOPHEN 650 MG RE SUPP: 650.0000 mg | Freq: Four times a day (QID) | RECTAL | Status: DC | PRN

## 2021-02-15 MED ORDER — IRBESARTAN 75 MG PO TABS
150.0000 mg | ORAL_TABLET | Freq: Every day | ORAL | Status: DC
  Administered 2021-02-15 – 2021-02-17 (×3): 150 mg via ORAL
  Filled 2021-02-15 (×3): qty 2

## 2021-02-15 MED ORDER — ACETAMINOPHEN 325 MG PO TABS
650.0000 mg | ORAL_TABLET | Freq: Four times a day (QID) | ORAL | Status: DC | PRN
  Administered 2021-02-17: 650 mg via ORAL
  Filled 2021-02-15: qty 2

## 2021-02-15 MED ORDER — HYDRALAZINE HCL 50 MG PO TABS
50.0000 mg | ORAL_TABLET | Freq: Three times a day (TID) | ORAL | Status: DC
  Filled 2021-02-15: qty 1

## 2021-02-15 MED ORDER — POTASSIUM CHLORIDE CRYS ER 20 MEQ PO TBCR
20.0000 meq | EXTENDED_RELEASE_TABLET | ORAL | Status: AC
  Administered 2021-02-15: 20 meq via ORAL
  Filled 2021-02-15: qty 1

## 2021-02-15 MED ORDER — PANTOPRAZOLE SODIUM 40 MG PO TBEC
40.0000 mg | DELAYED_RELEASE_TABLET | Freq: Every day | ORAL | Status: DC
  Administered 2021-02-15 – 2021-02-17 (×3): 40 mg via ORAL
  Filled 2021-02-15 (×3): qty 1

## 2021-02-15 MED ORDER — ALBUTEROL SULFATE (2.5 MG/3ML) 0.083% IN NEBU: 2.5000 mg | INHALATION_SOLUTION | Freq: Four times a day (QID) | RESPIRATORY_TRACT | Status: DC | PRN

## 2021-02-15 MED ORDER — TRAMADOL HCL 50 MG PO TABS
50.0000 mg | ORAL_TABLET | Freq: Four times a day (QID) | ORAL | Status: DC | PRN
Start: 2021-02-15 — End: 2021-02-17
  Administered 2021-02-16: 50 mg via ORAL
  Filled 2021-02-15: qty 1

## 2021-02-15 MED ORDER — GUAIFENESIN ER 600 MG PO TB12
600.0000 mg | ORAL_TABLET | Freq: Two times a day (BID) | ORAL | Status: DC
  Administered 2021-02-15 – 2021-02-17 (×5): 600 mg via ORAL
  Filled 2021-02-15 (×5): qty 1

## 2021-02-15 MED ORDER — LATANOPROST 0.005 % OP SOLN
1.0000 [drp] | Freq: Every day | OPHTHALMIC | Status: DC
  Administered 2021-02-15 – 2021-02-16 (×2): 1 [drp] via OPHTHALMIC
  Filled 2021-02-15 (×2): qty 2.5

## 2021-02-15 MED ORDER — ONDANSETRON HCL 4 MG/2ML IJ SOLN
4.0000 mg | Freq: Once | INTRAMUSCULAR | Status: AC
  Administered 2021-02-15: 4 mg via INTRAVENOUS
  Filled 2021-02-15: qty 2

## 2021-02-15 MED ORDER — NEBIVOLOL HCL 10 MG PO TABS
10.0000 mg | ORAL_TABLET | Freq: Every morning | ORAL | Status: DC
  Administered 2021-02-15 – 2021-02-17 (×3): 10 mg via ORAL
  Filled 2021-02-15 (×3): qty 1

## 2021-02-15 MED ORDER — POTASSIUM CHLORIDE CRYS ER 20 MEQ PO TBCR: 40.0000 meq | EXTENDED_RELEASE_TABLET | ORAL | Status: DC

## 2021-02-15 MED ORDER — FENTANYL CITRATE (PF) 100 MCG/2ML IJ SOLN
50.0000 ug | Freq: Once | INTRAMUSCULAR | Status: AC
  Administered 2021-02-15: 50 ug via INTRAVENOUS
  Filled 2021-02-15: qty 2

## 2021-02-15 MED ORDER — AMIODARONE HCL 200 MG PO TABS
100.0000 mg | ORAL_TABLET | Freq: Every day | ORAL | Status: DC
  Administered 2021-02-15 – 2021-02-17 (×3): 100 mg via ORAL
  Filled 2021-02-15 (×3): qty 1

## 2021-02-15 MED ORDER — MONTELUKAST SODIUM 10 MG PO TABS
10.0000 mg | ORAL_TABLET | Freq: Every day | ORAL | Status: DC
  Administered 2021-02-15 – 2021-02-17 (×3): 10 mg via ORAL
  Filled 2021-02-15 (×3): qty 1

## 2021-02-15 MED ORDER — HYDRALAZINE HCL 50 MG PO TABS
50.0000 mg | ORAL_TABLET | Freq: Two times a day (BID) | ORAL | Status: DC
  Administered 2021-02-15 – 2021-02-16 (×4): 50 mg via ORAL
  Administered 2021-02-17: 100 mg via ORAL
  Filled 2021-02-15 (×2): qty 1
  Filled 2021-02-15: qty 2
  Filled 2021-02-15: qty 1

## 2021-02-15 MED ORDER — ONDANSETRON HCL 4 MG/2ML IJ SOLN: 4.0000 mg | Freq: Four times a day (QID) | INTRAMUSCULAR | Status: DC | PRN

## 2021-02-15 MED ORDER — PRAVASTATIN SODIUM 10 MG PO TABS
10.0000 mg | ORAL_TABLET | Freq: Every day | ORAL | Status: DC
  Administered 2021-02-15 – 2021-02-16 (×2): 10 mg via ORAL
  Filled 2021-02-15 (×2): qty 1

## 2021-02-15 MED ORDER — SODIUM CHLORIDE 0.9% IV SOLUTION: Freq: Once | INTRAVENOUS | Status: AC

## 2021-02-15 MED ORDER — ONDANSETRON HCL 4 MG PO TABS: 4.0000 mg | ORAL_TABLET | Freq: Four times a day (QID) | ORAL | Status: DC | PRN

## 2021-02-15 MED ORDER — SODIUM CHLORIDE 0.9% FLUSH
3.0000 mL | Freq: Two times a day (BID) | INTRAVENOUS | Status: DC
  Administered 2021-02-15 – 2021-02-17 (×5): 3 mL via INTRAVENOUS

## 2021-02-15 MED ORDER — FUROSEMIDE 10 MG/ML IJ SOLN
40.0000 mg | Freq: Two times a day (BID) | INTRAMUSCULAR | Status: AC
  Administered 2021-02-15 – 2021-02-16 (×2): 40 mg via INTRAVENOUS
  Filled 2021-02-15 (×3): qty 4

## 2021-02-15 MED ORDER — PREDNISONE 5 MG (21) PO TBPK
5.0000 mg | ORAL_TABLET | ORAL | Status: DC
  Filled 2021-02-15: qty 21

## 2021-02-15 NOTE — ED Provider Notes (Signed)
[facility] Provider Note   CSN: [id number] Arrival date & time: 02/15/21  0713     History Chief Complaint  Patient presents with  . Abdominal Pain    [name] is a 85 y.o. female.  HPI      85 year old female with a history of hypertension, CKD, atrial fibrillation, coronary artery disease, pacemaker, appendectomy, hysterectomy who presents with concern for abdominal pain and reports worsening dyspnea.   Abdominal pain was present in February initially but improved (reports she was treated for infection, seen by urology) and then worsened over the last 2 weeks.  Has hx of kidney stones and feels like the pain is similar. Severe pain.  Nausea but no vomiting. No dysuria but does have some pressure. Denies bulging from pelvis, has had hysterectomy. Does look at her stool and has not seen black or bloody stools, reports positive hemoccult and ws just scheduled for outpatient colonoscopy with her GI physician. The abdominal pain is worse with ambulation and standing.  Reports dyspnea over the last week, is worse with ambulation. Denies orthopenea. Reports worsening swelling of legs. No chest pain today. No fever.   Past Medical History:  Diagnosis Date  . Arthritis   . Bradycardia   . Coronary artery disease   . Gout   . Hypertension   . Leg weakness, bilateral   . Pacemaker   . Renal disorder    kidney stones  . Renal insufficiency     Patient Active Problem List   Diagnosis Date Noted  . Anemia 02/15/2021  . Hypertensive urgency 02/15/2021  . Hypokalemia 02/15/2021  . CKD (chronic kidney disease), stage IV ([place]) 02/15/2021  . Acute diastolic CHF (congestive heart failure) ([place]) 02/15/2021  . CKD (chronic kidney disease) stage 3, GFR 30-59 ml/min ([facility]) 10/17/2013  . Elevated liver enzymes 10/16/2013  . Abdominal pain, epigastric 10/16/2013  . Acute on chronic renal insufficiency 10/16/2013  . Chest pain 10/15/2013  . CAD (coronary  artery disease) 10/15/2013  . Hypertension 10/15/2013  . Atrial fibrillation ([place]) 10/15/2013  . Pacemaker     Past Surgical History:  Procedure Laterality Date  . ABDOMINAL HYSTERECTOMY    . APPENDECTOMY    . arm surgery    . JOINT REPLACEMENT     knee replacement  . PACEMAKER INSERTION    . REPLACEMENT TOTAL KNEE BILATERAL    . SHOULDER SURGERY    . tummy tuck       OB History    Gravida  1   Para  1   Term  1   Preterm      AB      Living  1     SAB      IAB      Ectopic      Multiple      Live Births  1           History reviewed. No pertinent family history.  Social History   Tobacco Use  . Smoking status: Never Smoker  . Smokeless tobacco: Never Used  Vaping Use  . Vaping Use: Never used  Substance Use Topics  . Alcohol use: Not Currently  . Drug use: No    Home Medications Prior to Admission medications   Medication Sig Start Date End Date Taking? Authorizing Provider  albuterol (VENTOLIN HFA) 108 (90 Base) MCG/ACT inhaler Inhale 2 puffs into the lungs every 6 (six) hours as needed for wheezing or shortness of breath. 06/22/17  Yes [provider]  amiodarone (PACERONE) 200 MG tablet Take 100 mg by mouth daily.   Yes [provider]  aspirin EC 81 MG tablet Take 81 mg by mouth every morning.   Yes [provider]  cholecalciferol (VITAMIN D) 1000 UNITS tablet Take 2,000 Units by mouth every morning.   Yes [provider]  Cholecalciferol 50 MCG (2000 UT) CAPS Take 2,000 Units by mouth daily.   Yes [provider]  fish oil-omega-3 fatty acids 1000 MG capsule Take 2 g by mouth every morning.   Yes [provider]  hydrALAZINE (APRESOLINE) 50 MG tablet Take 50 mg by mouth 3 (three) times daily.   Yes [provider]  latanoprost (XALATAN) 0.005 % ophthalmic solution Place 1 drop into both eyes at bedtime.   Yes [provider]  montelukast (SINGULAIR) 10 MG tablet Take  1 tablet by mouth daily. 01/05/21  Yes [provider]  nebivolol (BYSTOLIC) 10 MG tablet Take 10 mg by mouth every morning.   Yes [provider]  nitrofurantoin, macrocrystal-monohydrate, (MACROBID) 100 MG capsule Take 100 mg by mouth every 12 (twelve) hours. 02/10/21  Yes [provider]  omeprazole (PRILOSEC) 40 MG capsule Take 40 mg by mouth every morning.   Yes [provider]  ondansetron (ZOFRAN) 4 MG tablet Take 4 mg by mouth every 8 (eight) hours as needed for nausea. 11/21/20  Yes [provider]  pravastatin (PRAVACHOL) 10 MG tablet Take 10 mg by mouth at bedtime.   Yes [provider]  telmisartan (MICARDIS) 40 MG tablet Take 40 mg by mouth every morning.   Yes [provider]  torsemide (DEMADEX) 10 MG tablet Take 10 mg by mouth 3 (three) times a week. Monday, Wed, Friday   Yes [provider]  predniSONE (STERAPRED UNI-PAK 21 TAB) 5 MG (21) TBPK tablet Take 5-10 mg by mouth See admin instructions. follow package directions as needed for flare up 12/19/20   [provider]    Allergies    Amoxicillin, Azithromycin, and Toprol xl [metoprolol succinate]  Review of Systems   Review of Systems  Constitutional: Positive for fatigue. Negative for appetite change and fever.  Respiratory: Positive for shortness of breath. Negative for cough.   Cardiovascular: Positive for leg swelling. Negative for chest pain (sometimes but not today).  Gastrointestinal: Positive for abdominal pain and nausea. Negative for anal bleeding, blood in stool, constipation, diarrhea and vomiting.  Genitourinary: Negative for dysuria, vaginal bleeding and vaginal discharge.  Musculoskeletal: Negative for back pain.  Skin: Negative for rash.    Physical Exam Updated Vital Signs BP (!) 184/93 (BP Location: Right Arm)   Pulse 91   Temp 99.1 F (37.3 C) (Oral)   Resp 18   Ht '5\' 5"'$  (1.651 m)   Wt 73.9 kg   SpO2 96%   BMI 27.12  kg/m   Physical Exam Vitals and nursing note reviewed.  Constitutional:      General: She is not in acute distress.    Appearance: She is well-developed. She is not diaphoretic.  HENT:     Head: Normocephalic and atraumatic.  Eyes:     Conjunctiva/sclera: Conjunctivae normal.  Cardiovascular:     Rate and Rhythm: Normal rate and regular rhythm.     Heart sounds: Normal heart sounds. No murmur heard. No friction rub. No gallop.   Pulmonary:     Effort: Pulmonary effort is normal. No respiratory distress.     Breath sounds:  Normal breath sounds. No wheezing or rales.  Abdominal:     General: There is no distension.     Palpations: Abdomen is soft.     Tenderness: There is generalized abdominal tenderness. There is no guarding.  Musculoskeletal:        General: Swelling present. No tenderness.     Cervical back: Normal range of motion.     Right lower leg: Edema present.     Left lower leg: Edema present.  Skin:    General: Skin is warm and dry.     Findings: No erythema or rash.  Neurological:     Mental Status: She is alert and oriented to person, place, and time.     ED Results / Procedures / Treatments   Labs (all labs ordered are listed, but only abnormal results are displayed) Labs Reviewed  URINALYSIS, ROUTINE W REFLEX MICROSCOPIC - Abnormal; Notable for the following components:      Result Value   Specific Gravity, Urine <1.005 (*)    Protein, ur 30 (*)    All other components within normal limits  CBC WITH DIFFERENTIAL/PLATELET - Abnormal; Notable for the following components:   RBC 2.46 (*)    Hemoglobin 7.4 (*)    HCT 23.1 (*)    All other components within normal limits  COMPREHENSIVE METABOLIC PANEL - Abnormal; Notable for the following components:   Potassium 3.4 (*)    Glucose, Bld 100 (*)    BUN 34 (*)    Creatinine, Ser 2.41 (*)    Calcium 8.5 (*)    Total Protein 6.3 (*)    Albumin 2.7 (*)    GFR, Estimated 19 (*)    All other components  within normal limits  URINALYSIS, MICROSCOPIC (REFLEX) - Abnormal; Notable for the following components:   Bacteria, UA RARE (*)    All other components within normal limits  BRAIN NATRIURETIC PEPTIDE - Abnormal; Notable for the following components:   B Natriuretic Peptide 750.9 (*)    All other components within normal limits  IRON AND TIBC - Abnormal; Notable for the following components:   Iron 22 (*)    TIBC 210 (*)    Saturation Ratios 10 (*)    All other components within normal limits  RETICULOCYTES - Abnormal; Notable for the following components:   RBC. 2.44 (*)    All other components within normal limits  IRON AND TIBC - Abnormal; Notable for the following components:   Iron 23 (*)    TIBC 220 (*)    Saturation Ratios 10 (*)    All other components within normal limits  RETICULOCYTES - Abnormal; Notable for the following components:   RBC. 2.64 (*)    All other components within normal limits  SEDIMENTATION RATE - Abnormal; Notable for the following components:   Sed Rate 70 (*)    All other components within normal limits  C-REACTIVE PROTEIN - Abnormal; Notable for the following components:   CRP 8.3 (*)    All other components within normal limits  HEMOGLOBIN AND HEMATOCRIT, BLOOD - Abnormal; Notable for the following components:   Hemoglobin 7.9 (*)    HCT 24.9 (*)    All other components within normal limits  TROPONIN I (HIGH SENSITIVITY) - Abnormal; Notable for the following components:   Troponin I (High Sensitivity) 23 (*)    All other components within normal limits  TROPONIN I (HIGH SENSITIVITY) - Abnormal; Notable for the following components:   Troponin I (High  Sensitivity) 23 (*)    All other components within normal limits  RESP PANEL BY RT-PCR (FLU A&B, COVID) ARPGX2  URINE CULTURE  LIPASE, BLOOD  VITAMIN B12  FOLATE  FERRITIN  VITAMIN B12  FOLATE  FERRITIN  PROTIME-INR  CBC  BASIC METABOLIC PANEL  TYPE AND SCREEN  PREPARE RBC (CROSSMATCH)   ABO/RH    EKG EKG Interpretation  Date/Time:  Sunday Feb 15 2021 07:53:44 EDT Ventricular Rate:  77 PR Interval:  181 QRS Duration: 96 QT Interval:  400 QTC Calculation: 453 R Axis:   41 Text Interpretation: Sinus rhythm Baseline wander in lead(s) V6 No significant change since last tracing Confirmed by Gareth Morgan [phone]) on 02/15/2021 9:36:38 PM   Radiology CT Chest Wo Contrast  Result Date: 02/15/2021 CLINICAL DATA:  Chest pain or shortness of breath. Evaluate for metastatic disease EXAM: CT CHEST WITHOUT CONTRAST TECHNIQUE: Multidetector CT imaging of the chest was performed following the standard protocol without IV contrast. COMPARISON:  10/15/2013 FINDINGS: Cardiovascular: Normal heart size. Dual-chamber pacer leads present. No pericardial effusion. Extensive atheromatous calcification of the aorta and coronaries. Mediastinum/Nodes: Negative for adenopathy or mass. Lungs/Pleura: Multiple irregular pulmonary nodules which are seemingly random in distribution. Largest is in the lingula at 16 mm. No edema, effusion, or pneumothorax. Subpleural scarring in the right lower lobe associated with thoracic osteophytes. Upper Abdomen: Reported separately Musculoskeletal: No acute finding IMPRESSION: 1. Multiple pulmonary nodules with irregular appearance favoring infectious/inflammatory process over metastatic disease. Recommend chest CT follow-up in 2 months to differentiate. 2. Aortic and coronary atherosclerosis. Electronically Signed   By: Monte Fantasia M.D.   On: 02/15/2021 09:54   CT Renal Stone Study  Result Date: 02/15/2021 CLINICAL DATA:  84 year old female with history of left-sided flank pain and left lower quadrant abdominal pain. Suspected kidney stone. Potential diverticulitis. EXAM: CT ABDOMEN AND PELVIS WITHOUT CONTRAST TECHNIQUE: Multidetector CT imaging of the abdomen and pelvis was performed following the standard protocol without IV contrast. COMPARISON:  CT the  abdomen and pelvis 06/06/2012. FINDINGS: Lower chest: Multiple pulmonary nodules noted in the visualize lung bases measuring up to 1.6 x 1.1 cm in the left upper lobe posteriorly (axial image 3 of series 4). Atherosclerotic calcifications in the thoracic aorta as well as the left anterior descending and left circumflex coronary arteries. Calcifications of the aortic valve. Pacemaker leads terminating in the right atrium and right ventricular apex. Hepatobiliary: Multiple tiny calcified granulomas in the lungs bilaterally. Liver has a slightly shrunken appearance and nodular contour, suggesting mild cirrhosis. Subcentimeter low-attenuation lesion in the right lobe of the liver (axial image 26 of series 2), incompletely characterized on today's non-contrast CT examination, but statistically likely to represent a cyst. No other larger more concerning liver lesions are confidently identified on today's noncontrast CT examination. Status post cholecystectomy. Pancreas: No definite pancreatic mass or peripancreatic fluid collections or inflammatory changes are confidently identified on today's noncontrast CT examination. Spleen: Unremarkable. Adrenals/Urinary Tract: No calcifications are identified within the collecting system of either kidney, along the course of either ureter, or within the lumen of the urinary bladder. No hydroureteronephrosis. In the lower pole of the right kidney there is a 1.8 x 1.1 cm low-attenuation lesion which is incompletely characterized on today's non-contrast CT examination, but statistically likely to represent a tiny cyst. Multifocal cortical scarring in the left kidney. Bilateral adrenal glands are normal in appearance. Unenhanced appearance of the urinary bladder is normal. Stomach/Bowel: Suture line in the proximal stomach. No pathologic dilatation of small  bowel or colon. Normal appendix. Vascular/Lymphatic: Aortic atherosclerosis. No lymphadenopathy noted in the abdomen or pelvis.  Reproductive: Status post hysterectomy. Ovaries are unremarkable in appearance. Other: No significant volume of ascites.  No pneumoperitoneum. Musculoskeletal: There are no aggressive appearing lytic or blastic lesions noted in the visualized portions of the skeleton. IMPRESSION: 1. No acute findings are noted in the abdomen or pelvis to account for the patient's symptoms. Specifically, no urinary tract calculi no findings of urinary tract obstruction. Additionally, there is no evidence of acute diverticulitis. 2. Multiple pulmonary nodules noted throughout the visualize lung bases measuring up to 1.6 x 1.1 cm in the inferior aspect of the left upper lobe. The possibility of metastatic disease to the lungs should be considered. Further evaluation with noncontrast chest CT is recommended at this time to establish a baseline for future follow-up examinations. 3. Aortic atherosclerosis, in addition to two vessel coronary artery disease. 4. There are calcifications of the aortic valve. Echocardiographic correlation for evaluation of potential valvular dysfunction may be warranted if clinically indicated. 5. Additional incidental findings, as above. Electronically Signed   By: Vinnie Langton M.D.   On: 02/15/2021 08:32    Procedures Procedures   Medications Ordered in ED Medications  sodium chloride flush (NS) 0.9 % injection 3 mL (3 mLs Intravenous Given 02/15/21 2125)  acetaminophen (TYLENOL) tablet 650 mg (has no administration in time range)    Or  acetaminophen (TYLENOL) suppository 650 mg (has no administration in time range)  ondansetron (ZOFRAN) tablet 4 mg (has no administration in time range)    Or  ondansetron (ZOFRAN) injection 4 mg (has no administration in time range)  albuterol (PROVENTIL) (2.5 MG/3ML) 0.083% nebulizer solution 2.5 mg (has no administration in time range)  guaiFENesin (MUCINEX) 12 hr tablet 600 mg (600 mg Oral Given 02/15/21 2124)  furosemide (LASIX) injection 40 mg (40 mg  Intravenous Given 02/15/21 1452)  amiodarone (PACERONE) tablet 100 mg (100 mg Oral Given 02/15/21 1509)  hydrALAZINE (APRESOLINE) tablet 50-100 mg (50 mg Oral Given 02/15/21 2124)  nebivolol (BYSTOLIC) tablet 10 mg (10 mg Oral Given 02/15/21 1722)  irbesartan (AVAPRO) tablet 150 mg (150 mg Oral Given 02/15/21 1509)  pravastatin (PRAVACHOL) tablet 10 mg (10 mg Oral Given 02/15/21 2124)  pantoprazole (PROTONIX) EC tablet 40 mg (40 mg Oral Given 02/15/21 1509)  montelukast (SINGULAIR) tablet 10 mg (10 mg Oral Given 02/15/21 1509)  latanoprost (XALATAN) 0.005 % ophthalmic solution 1 drop (1 drop Both Eyes Given 02/15/21 2147)  traMADol (ULTRAM) tablet 50 mg (has no administration in time range)  fentaNYL (SUBLIMAZE) injection 50 mcg (50 mcg Intravenous Given 02/15/21 0821)  ondansetron (ZOFRAN) injection 4 mg (4 mg Intravenous Given 02/15/21 0819)  potassium chloride SA (KLOR-CON) CR tablet 20 mEq (20 mEq Oral Given 02/15/21 1448)  0.9 %  sodium chloride infusion (Manually program via Guardrails IV Fluids) ( Intravenous New Bag/Given 02/15/21 1543)    ED Course  I have reviewed the triage vital signs and the nursing notes.  Pertinent labs & imaging results that were available during my care of the patient were reviewed by me and considered in my medical decision making (see chart for details).    MDM Rules/Calculators/A&P                          85 year old female with a history of hypertension, CKD, atrial fibrillation, coronary artery disease, pacemaker, appendectomy, hysterectomy who presents with concern for abdominal pain and reports worsening  dyspnea.   Regarding abdominal pain, lipase WNL, no sign of hepatitis. Cr at baseline. CT stone study shows no evidence of acute abnormality as etiology of abdominal pain. Has good bilateral lower and upper extremity pulses and doubt aortic dissetcion.  No sign of UTI.  Unclear etiology of abdominal pain. Consider anginal equivalent as it worsens with exertion  and she ahs dyspnea, although location very atypical.  Do not see emergent etiology of abdominal pain.  Labs are significnat for hemoglobin down to 7.4, last in our system 9, noted to be 8.1 at PCP.  She does report fatigue, dyspnea, and specifically dyspnea on exertion and additionally has signs of possible CHF on exam. Will admit for transfusion and symptomatic anemia for observation.  Ordered CT chest given presence of lung nodules noted on abdominal CT and to provide further evaluation of dyspnea etiologies.  Final Clinical Impression(s) / ED Diagnoses Final diagnoses:  Symptomatic anemia  Lower abdominal pain    Rx / DC Orders ED Discharge Orders    None       Gareth Morgan, MD 02/15/21 2231

## 2021-02-15 NOTE — Progress Notes (Signed)
Patient blood pressure 200/77, MD at bedside and has been notified, RN will continue to monitor this patient.

## 2021-02-15 NOTE — ED Notes (Signed)
Pt provided with meal per request and Dr. Billy Fischer approval. Does not believe pt will be scoped today.

## 2021-02-15 NOTE — ED Notes (Signed)
Patient transported to CT 

## 2021-02-15 NOTE — ED Notes (Signed)
Attempted to call report to receiving nurse, nurse unavailable, awaiting call.

## 2021-02-15 NOTE — Consult Note (Signed)
Impression: LLQ pain - not clearly OB/GYN related  Previous hysterectomy due to fibroids  No evidence of vaginal vault prolapse  Pelvic organ prolapse is not usually associated with any type of abdominal pain.  It is usually just a bulging sensation that does become worse with walking and might lead to a change in gait, however, this patient adamantly denies that this is what is happening.  Additionally, the patient reports having seen a Urologist who told her everything was fine with her bladder, and that it was not falling down which would be the most common pelvic organ to prolapse. She does not have a uterus so vaginal vault prolapse or rectocele versus rectal prolapse could also be occurring.  It is very difficult to do a thorough exam and a hospital bed setting and will arrange for outpatient follow-up for her for formal exam to ensure that this is not a problem. Treatment for this, if it was a problem, would usually involve placement of a pessary or possible surgery though she is unlikely to be a good surgical candidate.  Pelvic sonogram unlikely to be useful.  She does not have an elevated white count and her exam reveals no rebound but does show left lower quadrant tenderness.  MRSA carrier - on precautions  Chronic renal failure stage IIIa - per primary team  Coronary artery disease - per primary team  Atrial fibrillation - per primary team  Acute diastolic heart failure - per primary team  Hypertension - poorly controlled, per primary team  Positive screen for colorectal cancer and pulmonary nodules - including weight loss raise concern for primary colon issue.  Recommendations: Will provide outpatient follow-up in the [place] office for complete exam.  Reason for consult: Patient is a 85 y.o. G69P1001 female with past medical history of coronary artery disease, hypertension, chronic renal insufficiency stage IIIa, chronic anemia who gets most of her care in [facility].  Patient has seen oncology, cardiology, podiatry, GI, her primary care who have all been working up all of these things.  The patient's presented to the [facility] with left lower quadrant pain that she says has been present for approximately the last 2 weeks.  She reports that is worse when she walks.  And she is forced to walk a different kind of way.  She did not has that anything is bulging in her vagina.  She also reports having seen urology who told her bladder was okay.  She does report having a positive Cologuard screen that was done approximately 1 year ago but was recently resulted and she has a referral to GI next week.  She denies any constipation, diarrhea, nausea, emesis.  She reports her appetite is diminished somewhat. She also reports recent 20# weight loss. This pain is sharp is mostly located in the left lower quadrant and is worse with movement.  She does not take any medication to improve this pain.  She reports having hysterectomy as a younger woman due to fibroids.  She has no idea whether they took her ovaries at that time, though they appear normal on renal stone CT. We are asked to see the patient regarding possible pelvic organ prolapse as a reason for her pain..  Past Medical History:  Diagnosis Date  . Arthritis   . Bradycardia   . Coronary artery disease   . Gout   . Hypertension   . Leg weakness, bilateral   . Pacemaker   .  Renal disorder    kidney stones  . Renal insufficiency     Past Surgical History:  Procedure Laterality Date  . ABDOMINAL HYSTERECTOMY    . APPENDECTOMY    . arm surgery    . JOINT REPLACEMENT     knee replacement  . PACEMAKER INSERTION    . REPLACEMENT TOTAL KNEE BILATERAL    . SHOULDER SURGERY    . tummy tuck      History reviewed. No pertinent family history.  Social History   Socioeconomic History  . Marital status: Divorced    Spouse name: Not on file  . Number of children: Not on file  . Years of  education: Not on file  . Highest education level: Not on file  Occupational History  . Not on file  Tobacco Use  . Smoking status: Never Smoker  . Smokeless tobacco: Never Used  Vaping Use  . Vaping Use: Never used  Substance and Sexual Activity  . Alcohol use: Not Currently  . Drug use: No  . Sexual activity: Not on file  Other Topics Concern  . Not on file  Social History Narrative  . Not on file   Social Determinants of Health   Financial Resource Strain: Not on file  Food Insecurity: Not on file  Transportation Needs: Not on file  Physical Activity: Not on file  Stress: Not on file  Social Connections: Not on file  Intimate Partner Violence: Not on file    . sodium chloride   Intravenous Once  . furosemide  40 mg Intravenous BID  . guaiFENesin  600 mg Oral BID  . hydrALAZINE  50 mg Oral TID  . potassium chloride  20 mEq Oral STAT  . sodium chloride flush  3 mL Intravenous Q12H    Allergies  Allergen Reactions  . Amoxicillin Swelling    Cause mouth swelling and itching  . Azithromycin Nausea And Vomiting  . Toprol Xl [Metoprolol Succinate] Rash and Other (See Comments)    Reaction: hair loss    Review of Systems - Negative except as per HPI  Exam Vitals:   02/15/21 1130 02/15/21 1328  BP: (!) 165/73 (!) 200/77  Pulse:  80  Resp: 16 20  Temp:  98.2 F (36.8 C)  SpO2: 98% 99%    Physical Examination: General appearance - alert, well appearing, and in no distress Chest - normal effort Heart - normal rate and regular rhythm Abdomen - soft, LLQ tenderness, without guarding, no rebound Neurological - alert, oriented, normal speech, no focal findings or movement disorder noted Extremities - symmetric Skin - dry and warm  Labs:  Results for orders placed or performed during the hospital encounter of 02/15/21 (from the past 24 hour(s))  Urinalysis, Routine w reflex microscopic Urine, Clean Catch     Status: Abnormal   Collection Time: 02/15/21  7:41  AM  Result Value Ref Range   Color, Urine YELLOW YELLOW   APPearance CLEAR CLEAR   Specific Gravity, Urine <1.005 (L) 1.005 - 1.030   pH 6.0 5.0 - 8.0   Glucose, UA NEGATIVE NEGATIVE mg/dL   Hgb urine dipstick NEGATIVE NEGATIVE   Bilirubin Urine NEGATIVE NEGATIVE   Ketones, ur NEGATIVE NEGATIVE mg/dL   Protein, ur 30 (A) NEGATIVE mg/dL   Nitrite NEGATIVE NEGATIVE   Leukocytes,Ua NEGATIVE NEGATIVE  Urinalysis, Microscopic (reflex)     Status: Abnormal   Collection Time: 02/15/21  7:41 AM  Result Value Ref Range   RBC / HPF 0-5  0 - 5 RBC/hpf   WBC, UA 0-5 0 - 5 WBC/hpf   Bacteria, UA RARE (A) NONE SEEN   Squamous Epithelial / LPF 0-5 0 - 5  CBC with Differential     Status: Abnormal   Collection Time: 02/15/21  8:04 AM  Result Value Ref Range   WBC 7.2 4.0 - 10.5 K/uL   RBC 2.46 (L) 3.87 - 5.11 MIL/uL   Hemoglobin 7.4 (L) 12.0 - 15.0 g/dL   HCT 23.1 (L) 36.0 - 46.0 %   MCV 93.9 80.0 - 100.0 fL   MCH 30.1 26.0 - 34.0 pg   MCHC 32.0 30.0 - 36.0 g/dL   RDW 15.4 11.5 - 15.5 %   Platelets 155 150 - 400 K/uL   nRBC 0.0 0.0 - 0.2 %   Neutrophils Relative % 81 %   Neutro Abs 5.8 1.7 - 7.7 K/uL   Lymphocytes Relative 10 %   Lymphs Abs 0.7 0.7 - 4.0 K/uL   Monocytes Relative 8 %   Monocytes Absolute 0.6 0.1 - 1.0 K/uL   Eosinophils Relative 1 %   Eosinophils Absolute 0.1 0.0 - 0.5 K/uL   Basophils Relative 0 %   Basophils Absolute 0.0 0.0 - 0.1 K/uL   Immature Granulocytes 0 %   Abs Immature Granulocytes 0.03 0.00 - 0.07 K/uL  Comprehensive metabolic panel     Status: Abnormal   Collection Time: 02/15/21  8:04 AM  Result Value Ref Range   Sodium 143 135 - 145 mmol/L   Potassium 3.4 (L) 3.5 - 5.1 mmol/L   Chloride 110 98 - 111 mmol/L   CO2 27 22 - 32 mmol/L   Glucose, Bld 100 (H) 70 - 99 mg/dL   BUN 34 (H) 8 - 23 mg/dL   Creatinine, Ser 2.41 (H) 0.44 - 1.00 mg/dL   Calcium 8.5 (L) 8.9 - 10.3 mg/dL   Total Protein 6.3 (L) 6.5 - 8.1 g/dL   Albumin 2.7 (L) 3.5 - 5.0 g/dL    AST 16 15 - 41 U/L   ALT 10 0 - 44 U/L   Alkaline Phosphatase 63 38 - 126 U/L   Total Bilirubin 0.5 0.3 - 1.2 mg/dL   GFR, Estimated 19 (L) >60 mL/min   Anion gap 6 5 - 15  Lipase, blood     Status: None   Collection Time: 02/15/21  8:04 AM  Result Value Ref Range   Lipase 24 11 - 51 U/L  Brain natriuretic peptide     Status: Abnormal   Collection Time: 02/15/21  8:04 AM  Result Value Ref Range   B Natriuretic Peptide 750.9 (H) 0.0 - 100.0 pg/mL  Troponin I (High Sensitivity)     Status: Abnormal   Collection Time: 02/15/21  8:04 AM  Result Value Ref Range   Troponin I (High Sensitivity) 23 (H) <18 ng/L  Resp Panel by RT-PCR (Flu A&B, Covid) Nasopharyngeal Swab     Status: None   Collection Time: 02/15/21  9:12 AM   Specimen: Nasopharyngeal Swab; Nasopharyngeal(NP) swabs in vial transport medium  Result Value Ref Range   SARS Coronavirus 2 by RT PCR NEGATIVE NEGATIVE   Influenza A by PCR NEGATIVE NEGATIVE   Influenza B by PCR NEGATIVE NEGATIVE  Troponin I (High Sensitivity)     Status: Abnormal   Collection Time: 02/15/21 10:46 AM  Result Value Ref Range   Troponin I (High Sensitivity) 23 (H) <18 ng/L  Vitamin B12     Status: None  Collection Time: 02/15/21  1:08 PM  Result Value Ref Range   Vitamin B-12 236 180 - 914 pg/mL  Folate     Status: None   Collection Time: 02/15/21  1:08 PM  Result Value Ref Range   Folate 10.5 >5.9 ng/mL  Iron and TIBC     Status: Abnormal   Collection Time: 02/15/21  1:08 PM  Result Value Ref Range   Iron 23 (L) 28 - 170 ug/dL   TIBC 220 (L) 250 - 450 ug/dL   Saturation Ratios 10 (L) 10.4 - 31.8 %   UIBC 197 ug/dL  Ferritin     Status: None   Collection Time: 02/15/21  1:08 PM  Result Value Ref Range   Ferritin 129 11 - 307 ng/mL  Reticulocytes     Status: Abnormal   Collection Time: 02/15/21  1:08 PM  Result Value Ref Range   Retic Ct Pct 1.6 0.4 - 3.1 %   RBC. 2.64 (L) 3.87 - 5.11 MIL/uL   Retic Count, Absolute 42.5 19.0 - 186.0  K/uL   Immature Retic Fract 4.8 2.3 - 15.9 %  Sedimentation rate     Status: Abnormal   Collection Time: 02/15/21  1:08 PM  Result Value Ref Range   Sed Rate 70 (H) 0 - 22 mm/hr  C-reactive protein     Status: Abnormal   Collection Time: 02/15/21  1:08 PM  Result Value Ref Range   CRP 8.3 (H) <1.0 mg/dL  Hemoglobin and hematocrit, blood     Status: Abnormal   Collection Time: 02/15/21  1:08 PM  Result Value Ref Range   Hemoglobin 7.9 (L) 12.0 - 15.0 g/dL   HCT 24.9 (L) 36.0 - 46.0 %  Type and screen [place]     Status: None (Preliminary result)   Collection Time: 02/15/21  1:20 PM  Result Value Ref Range   ABO/RH(D) B POS    Antibody Screen NEG    Sample Expiration 02/18/2021,2359    Unit Number [id number]    Blood Component Type RED CELLS,LR    Unit division 00    Status of Unit ISSUED    Transfusion Status OK TO TRANSFUSE    Crossmatch Result      Compatible Performed at [facility], [address] [place], [place] [address]   Prepare RBC (crossmatch)     Status: None   Collection Time: 02/15/21  2:18 PM  Result Value Ref Range   Order Confirmation      ORDER PROCESSED BY BLOOD BANK Performed at [facility], [place] [address], [place], [place] [address]   Protime-INR     Status: None   Collection Time: 02/15/21  2:32 PM  Result Value Ref Range   Prothrombin Time 14.5 11.4 - 15.2 seconds   INR 1.1 0.8 - 1.2  ABO/Rh     Status: None   Collection Time: 02/15/21  2:33 PM  Result Value Ref Range   ABO/RH(D)      B POS Performed at [facility] [address], [place], [place] [address]      Radiological Studies CT Chest Wo Contrast  Result Date: 02/15/2021 CLINICAL DATA:  Chest pain or shortness of breath. Evaluate for metastatic disease EXAM: CT CHEST WITHOUT CONTRAST TECHNIQUE: Multidetector CT imaging of the chest was performed following the standard protocol without IV contrast. COMPARISON:  10/15/2013 FINDINGS:  Cardiovascular: Normal heart size. Dual-chamber pacer leads present. No pericardial effusion. Extensive  atheromatous calcification of the aorta and coronaries. Mediastinum/Nodes: Negative for adenopathy or mass. Lungs/Pleura: Multiple irregular pulmonary nodules which are seemingly random in distribution. Largest is in the lingula at 16 mm. No edema, effusion, or pneumothorax. Subpleural scarring in the right lower lobe associated with thoracic osteophytes. Upper Abdomen: Reported separately Musculoskeletal: No acute finding IMPRESSION: 1. Multiple pulmonary nodules with irregular appearance favoring infectious/inflammatory process over metastatic disease. Recommend chest CT follow-up in 2 months to differentiate. 2. Aortic and coronary atherosclerosis. Electronically Signed   By: Monte Fantasia M.D.   On: 02/15/2021 09:54   CT Renal Stone Study  Result Date: 02/15/2021 CLINICAL DATA:  85 year old female with history of left-sided flank pain and left lower quadrant abdominal pain. Suspected kidney stone. Potential diverticulitis. EXAM: CT ABDOMEN AND PELVIS WITHOUT CONTRAST TECHNIQUE: Multidetector CT imaging of the abdomen and pelvis was performed following the standard protocol without IV contrast. COMPARISON:  CT the abdomen and pelvis 06/06/2012. FINDINGS: Lower chest: Multiple pulmonary nodules noted in the visualize lung bases measuring up to 1.6 x 1.1 cm in the left upper lobe posteriorly (axial image 3 of series 4). Atherosclerotic calcifications in the thoracic aorta as well as the left anterior descending and left circumflex coronary arteries. Calcifications of the aortic valve. Pacemaker leads terminating in the right atrium and right ventricular apex. Hepatobiliary: Multiple tiny calcified granulomas in the lungs bilaterally. Liver has a slightly shrunken appearance and nodular contour, suggesting mild cirrhosis. Subcentimeter low-attenuation lesion in the right lobe of the liver (axial image 26 of  series 2), incompletely characterized on today's non-contrast CT examination, but statistically likely to represent a cyst. No other larger more concerning liver lesions are confidently identified on today's noncontrast CT examination. Status post cholecystectomy. Pancreas: No definite pancreatic mass or peripancreatic fluid collections or inflammatory changes are confidently identified on today's noncontrast CT examination. Spleen: Unremarkable. Adrenals/Urinary Tract: No calcifications are identified within the collecting system of either kidney, along the course of either ureter, or within the lumen of the urinary bladder. No hydroureteronephrosis. In the lower pole of the right kidney there is a 1.8 x 1.1 cm low-attenuation lesion which is incompletely characterized on today's non-contrast CT examination, but statistically likely to represent a tiny cyst. Multifocal cortical scarring in the left kidney. Bilateral adrenal glands are normal in appearance. Unenhanced appearance of the urinary bladder is normal. Stomach/Bowel: Suture line in the proximal stomach. No pathologic dilatation of small bowel or colon. Normal appendix. Vascular/Lymphatic: Aortic atherosclerosis. No lymphadenopathy noted in the abdomen or pelvis. Reproductive: Status post hysterectomy. Ovaries are unremarkable in appearance. Other: No significant volume of ascites.  No pneumoperitoneum. Musculoskeletal: There are no aggressive appearing lytic or blastic lesions noted in the visualized portions of the skeleton. IMPRESSION: 1. No acute findings are noted in the abdomen or pelvis to account for the patient's symptoms. Specifically, no urinary tract calculi no findings of urinary tract obstruction. Additionally, there is no evidence of acute diverticulitis. 2. Multiple pulmonary nodules noted throughout the visualize lung bases measuring up to 1.6 x 1.1 cm in the inferior aspect of the left upper lobe. The possibility of metastatic disease to  the lungs should be considered. Further evaluation with noncontrast chest CT is recommended at this time to establish a baseline for future follow-up examinations. 3. Aortic atherosclerosis, in addition to two vessel coronary artery disease. 4. There are calcifications of the aortic valve. Echocardiographic correlation for evaluation of potential valvular dysfunction may be warranted if clinically indicated. 5. Additional incidental findings,  as above. Electronically Signed   By: Vinnie Langton M.D.   On: 02/15/2021 08:32    Thank you so much for allowing [place] to participate in the care of this patient.  We will sign off and arrange outpatient follow-up. Please call the attending OB/GYN physician with questions or concerns at [phone] M-F 8a-5p, after hours and on weekend, we can be reached at [phone].

## 2021-02-15 NOTE — H&P (Addendum)
History and Physical    [name]:[id number] DOB: [birth year] DOA: 02/15/2021  Referring MD/NP/PA: Gareth Morgan PCP: Windell Hummingbird, PA-C  Consultants: Pulmonology([place]), nephrology, urology, cardiology, rheumatology, podiatry through [facility] Patient coming from: Transfer from [facility]  Chief Complaint: Abdominal pain  I have personally briefly reviewed patient's old medical records in [place]   HPI: [name] is a 85 y.o. female with medical history significant of hypertension, CAD, PAF on Coumadin, SSS s/p PM, nephrolithiasis, anemia, CKD stage IV, and gout presents with complaints of lower abdominal pain.  Patient reports he feels like something is dropping out of her stomach and has been going on for 3 months.  Pain is present when she bears down and is worse with walking worsen her to walk bowlegged.  Associated symptoms include dysuria she had been evaluated by urology and they had checked out her bladder stating everything was okay.  Over the last week she reports that she has been feeling more short of breath and thinks she is retaining fluid because her legs are swelling.  Her doctor had recently advised her to start taking torsemide 10 mg 3 times a week denies any significant fever, cough, chest pain, nausea, vomiting, diarrhea, or blood in stool.  She had recently gotten back a positive Cologuard and was set up to have a consult for colonoscopy on 31st of this month with [facility].  She is already followed by hematology as well for her anemia and receives outpatient infusions.  Her last colonoscopy was several years ago and she cannot recall if there is anything abnormal. Patient with prior history of appendectomy and hysterectomy.  ED Course: Upon admission into the emergency department patient was seen to be afebrile with respirations 19-24, blood pressures elevated to 203/83, and other vital signs maintained.  Labs significant for hemoglobin  7.4, potassium 3.4, BUN 34, creatinine 2.41, calcium 8.5, albumin 2.7, and lipase 24.  CT scan of the abdomen and pelvis noted pulmonary nodules, but no acute abnormality was found to cause patient's abdominal pain.  A CT scan of her chest was also performed which noted multiple pulmonary nodules concerning for possible infectious or inflammatory process.  Patient had been given fentanyl and Zofran.  TRH called to admit.  Review of Systems  Constitutional: Positive for malaise/fatigue. Negative for fever.  HENT: Negative for congestion.   Eyes: Negative for pain.  Respiratory: Positive for shortness of breath. Negative for cough.   Cardiovascular: Positive for leg swelling. Negative for chest pain.  Gastrointestinal: Positive for abdominal pain. Negative for blood in stool, nausea and vomiting.  Genitourinary: Positive for dysuria. Negative for hematuria.       Decreased urine output  Musculoskeletal: Negative for falls.  Skin: Negative for rash.  Neurological: Negative for focal weakness and loss of consciousness.  Endo/Heme/Allergies: Negative for polydipsia.  Psychiatric/Behavioral: Negative for memory loss and substance abuse.    Past Medical History:  Diagnosis Date  . Arthritis   . Bradycardia   . Coronary artery disease   . Gout   . Hypertension   . Leg weakness, bilateral   . Pacemaker   . Renal disorder    kidney stones  . Renal insufficiency     Past Surgical History:  Procedure Laterality Date  . ABDOMINAL HYSTERECTOMY    . APPENDECTOMY    . arm surgery    . JOINT REPLACEMENT     knee replacement  . PACEMAKER INSERTION    .  REPLACEMENT TOTAL KNEE BILATERAL    . SHOULDER SURGERY    . tummy tuck       reports that she has never smoked. She has never used smokeless tobacco. She reports previous alcohol use. She reports that she does not use drugs.  Allergies  Allergen Reactions  . Amoxicillin Swelling    Cause mouth swelling and itching  . Toprol Xl  [Metoprolol Succinate] Rash and Other (See Comments)    Reaction: hair loss    History reviewed. No pertinent family history.  Prior to Admission medications   Medication Sig Start Date End Date Taking? Authorizing Provider  amiodarone (PACERONE) 200 MG tablet Take 200 mg by mouth daily.    [provider]  aspirin EC 81 MG tablet Take 81 mg by mouth every morning.    [provider]  cholecalciferol (VITAMIN D) 1000 UNITS tablet Take 2,000 Units by mouth every morning.     [provider]  febuxostat (ULORIC) 40 MG tablet Take 40 mg by mouth every morning.     [provider]  fish oil-omega-3 fatty acids 1000 MG capsule Take 2 g by mouth every morning.     [provider]  folic acid (FOLVITE) 1 MG tablet Take 1 mg by mouth every morning.     [provider]  hydrALAZINE (APRESOLINE) 50 MG tablet Take 50 mg by mouth 3 (three) times daily.    [provider]  lidocaine (XYLOCAINE) 5 % ointment Apply 1 application topically 3 (three) times daily as needed. 10/10/18   Long, Wonda Olds, MD  methocarbamol (ROBAXIN) 500 MG tablet Take 1 tablet (500 mg total) by mouth every 8 (eight) hours as needed (muscle soreness). 09/09/13   Rolland Porter, MD  nebivolol (BYSTOLIC) 10 MG tablet Take 10 mg by mouth every morning.     [provider]  omeprazole (PRILOSEC) 40 MG capsule Take 40 mg by mouth every morning.    [provider]  pravastatin (PRAVACHOL) 10 MG tablet Take 10 mg by mouth at bedtime.    [provider]  telmisartan (MICARDIS) 40 MG tablet Take 40 mg by mouth every morning.    [provider]  torsemide (DEMADEX) 10 MG tablet Take 10 mg by mouth daily.    [provider]  warfarin (COUMADIN) 2.5 MG tablet Take 1.25-2.5 mg by mouth daily. Take 2.43m (whole tablet) every other day while alternating with 1.225m(1/2 tablets)    [provider]    Physical Exam:  Constitutional:  Elderly female currently in no acute distress Vitals:   02/15/21 0734 02/15/21 0745 02/15/21 0830  BP: (!) 203/83 (!) 191/74 (!) 197/86  Pulse: 90 80 83  Resp: (!) 24 19 19   Temp: 98.8 F (37.1 C)    TempSrc: Oral    SpO2: 100% 100% 97%  Weight: 73.9 kg    Height: 5' 5"  (1.651 m)     Eyes: PERRL, lids and conjunctivae normal ENMT: Mucous membranes are moist. Posterior pharynx clear of any exudate or lesions.  Neck: normal, supple, no masses, no thyromegaly Respiratory: clear to auscultation bilaterally, no wheezing, no crackles. Normal respiratory effort. No accessory muscle use.  Cardiovascular: Regular rate and rhythm, no murmurs / rubs / gallops.  At least +1 pitting lower extremity edema. 2+ pedal pulses. No carotid bruits.  Abdomen: Tenderness to palpation in the lower abdomen appreciated, no masses palpated. No hepatosplenomegaly. Bowel sounds positive.  Musculoskeletal: no clubbing / cyanosis.  Amputation of the  second right toe Skin: no rashes, lesions, ulcers. No induration Neurologic: CN 2-12 grossly intact. Sensation intact, DTR normal. Strength 5/5 in all 4.  Psychiatric: Normal judgment and insight. Alert and oriented x 3. Normal mood.     Labs on Admission: I have personally reviewed following labs and imaging studies  CBC: Recent Labs  Lab 02/15/21 0804  WBC 7.2  NEUTROABS 5.8  HGB 7.4*  HCT 23.1*  MCV 93.9  PLT 638   Basic Metabolic Panel: Recent Labs  Lab 02/15/21 0804  NA 143  K 3.4*  CL 110  CO2 27  GLUCOSE 100*  BUN 34*  CREATININE 2.41*  CALCIUM 8.5*   GFR: Estimated Creatinine Clearance: 17.8 mL/min (A) (by C-G formula based on SCr of 2.41 mg/dL (H)). Liver Function Tests: Recent Labs  Lab 02/15/21 0804  AST 16  ALT 10  ALKPHOS 63  BILITOT 0.5  PROT 6.3*  ALBUMIN 2.7*   Recent Labs  Lab 02/15/21 0804  LIPASE 24   No results for input(s): AMMONIA in the last 168 hours. Coagulation Profile: No results for input(s): INR,  PROTIME in the last 168 hours. Cardiac Enzymes: No results for input(s): CKTOTAL, CKMB, CKMBINDEX, TROPONINI in the last 168 hours. BNP (last 3 results) No results for input(s): PROBNP in the last 8760 hours. HbA1C: No results for input(s): HGBA1C in the last 72 hours. CBG: No results for input(s): GLUCAP in the last 168 hours. Lipid Profile: No results for input(s): CHOL, HDL, LDLCALC, TRIG, CHOLHDL, LDLDIRECT in the last 72 hours. Thyroid Function Tests: No results for input(s): TSH, T4TOTAL, FREET4, T3FREE, THYROIDAB in the last 72 hours. Anemia Panel: No results for input(s): VITAMINB12, FOLATE, FERRITIN, TIBC, IRON, RETICCTPCT in the last 72 hours. Urine analysis:    Component Value Date/Time   COLORURINE YELLOW 02/15/2021 0741   APPEARANCEUR CLEAR 02/15/2021 0741   LABSPEC <1.005 (L) 02/15/2021 0741   PHURINE 6.0 02/15/2021 0741   GLUCOSEU NEGATIVE 02/15/2021 0741   HGBUR NEGATIVE 02/15/2021 0741   BILIRUBINUR NEGATIVE 02/15/2021 0741   KETONESUR NEGATIVE 02/15/2021 0741   PROTEINUR 30 (A) 02/15/2021 0741   UROBILINOGEN 0.2 09/09/2013 1544   NITRITE NEGATIVE 02/15/2021 0741   LEUKOCYTESUR NEGATIVE 02/15/2021 0741   Sepsis Labs: No results found for this or any previous visit (from the past 240 hour(s)).   Radiological Exams on Admission: CT Renal Stone Study  Result Date: 02/15/2021 CLINICAL DATA:  85 year old female with history of left-sided flank pain and left lower quadrant abdominal pain. Suspected kidney stone. Potential diverticulitis. EXAM: CT ABDOMEN AND PELVIS WITHOUT CONTRAST TECHNIQUE: Multidetector CT imaging of the abdomen and pelvis was performed following the standard protocol without IV contrast. COMPARISON:  CT the abdomen and pelvis 06/06/2012. FINDINGS: Lower chest: Multiple pulmonary nodules noted in the visualize lung bases measuring up to 1.6 x 1.1 cm in the left upper lobe posteriorly (axial image 3 of series 4). Atherosclerotic calcifications in the  thoracic aorta as well as the left anterior descending and left circumflex coronary arteries. Calcifications of the aortic valve. Pacemaker leads terminating in the right atrium and right ventricular apex. Hepatobiliary: Multiple tiny calcified granulomas in the lungs bilaterally. Liver has a slightly shrunken appearance and nodular contour, suggesting mild cirrhosis. Subcentimeter low-attenuation lesion in the right lobe of the liver (axial image 26 of series 2), incompletely characterized on today's non-contrast CT examination, but statistically likely to represent a cyst. No other larger more concerning liver lesions are confidently identified on today's noncontrast CT examination.  Status post cholecystectomy. Pancreas: No definite pancreatic mass or peripancreatic fluid collections or inflammatory changes are confidently identified on today's noncontrast CT examination. Spleen: Unremarkable. Adrenals/Urinary Tract: No calcifications are identified within the collecting system of either kidney, along the course of either ureter, or within the lumen of the urinary bladder. No hydroureteronephrosis. In the lower pole of the right kidney there is a 1.8 x 1.1 cm low-attenuation lesion which is incompletely characterized on today's non-contrast CT examination, but statistically likely to represent a tiny cyst. Multifocal cortical scarring in the left kidney. Bilateral adrenal glands are normal in appearance. Unenhanced appearance of the urinary bladder is normal. Stomach/Bowel: Suture line in the proximal stomach. No pathologic dilatation of small bowel or colon. Normal appendix. Vascular/Lymphatic: Aortic atherosclerosis. No lymphadenopathy noted in the abdomen or pelvis. Reproductive: Status post hysterectomy. Ovaries are unremarkable in appearance. Other: No significant volume of ascites.  No pneumoperitoneum. Musculoskeletal: There are no aggressive appearing lytic or blastic lesions noted in the visualized  portions of the skeleton. IMPRESSION: 1. No acute findings are noted in the abdomen or pelvis to account for the patient's symptoms. Specifically, no urinary tract calculi no findings of urinary tract obstruction. Additionally, there is no evidence of acute diverticulitis. 2. Multiple pulmonary nodules noted throughout the visualize lung bases measuring up to 1.6 x 1.1 cm in the inferior aspect of the left upper lobe. The possibility of metastatic disease to the lungs should be considered. Further evaluation with noncontrast chest CT is recommended at this time to establish a baseline for future follow-up examinations. 3. Aortic atherosclerosis, in addition to two vessel coronary artery disease. 4. There are calcifications of the aortic valve. Echocardiographic correlation for evaluation of potential valvular dysfunction may be warranted if clinically indicated. 5. Additional incidental findings, as above. Electronically Signed   By: Vinnie Langton M.D.   On: 02/15/2021 08:32    EKG: Independently reviewed.  Sinus rhythm at 77 bpm without signs of significant  ischemic changes  Assessment/Plan Anemia of chronic kidney disease: Presents with complaints of dyspnea and was found to have hemoglobin of 7.4, and last had been 8.1 approximately 1 month ago. She is followed by hematology and receives Aranesp infusions the last of which was on 4/12.  Patient recently had positive Cologuard screening test and had been scheduled for outpatient colonoscopy with [facility] on 5/31.  She appears to be on aspirin and Coumadin has been discontinued. -Admit to a medical telemetry bed -anemia panel checked -Check INR -Type and screen and transfuse 1 unit of PRBCs -Continue to monitor H&H -Discussed with [facility] who recommend patient to keep outpatient follow-up appointment unless patient found to have gross signs of bleeding  Abdominal pain: Patient main complaint is of lower abdominal pain with walking.   Lipase and LFTs within normal limits.  CT scan of the abdomen pelvis without any clear findings that would correlate to patient's symptoms.  Urinalysis showed significant signs of a UTI.  Question issue with her pelvic floor. -OB/GYN consulted  Hypertensive urgency: Acute. On admission blood pressures elevated to 230/83.  Patient reports that she had missed all of her morning medications.  Home regimen includes hydralazine 100 mg every morning /50 mg daily at bedtime, diastolic 10 mg daily, Micardis 40 mg daily -Restart home blood pressure medication regimen  Diastolic congestive heart failure exacerbation: Acute.  Patient reports that she is retaining fluid.  On physical exam she has positive least 1+ pitting edema.  BNP was elevated at  750.9.   Last echocardiogram from 03/2020 revealed EF of 55 to 60% with mild aortic stenosis and mild tricuspid regurgitation. -Strict I&Os -Daily weights -Check echocardiogram -Held oral torosemide -Lasix 40 mg IV twice daily x2 doses -Reevaluate and determine either continued IV diuresis  Dyspnea pulmonary nodules: Patient reported having shortness of breath with exertion question if related with anemia and/or fluid overload.  Has multiple pulmonary nodules on CT scan of the chest.  No prior history of tobacco use.  She reports seeing a Dr. Gwenevere Ghazi of pulmonology at [facility].   -Check ESR and CRP -Continue Singulair with albuterol  nebs as needed -Recommend outpatient follow-up with Dr. Verdie Mosher  Hypokalemia: Acute.  Potassium 3.4 on admission. -KCl 20 mEq of potassium chloride p.o. -Continue to monitor and replace as needed.  Paroxysmal atrial fibrillation on Coumadin: Patient appears to be in sinus rhythm at this time.  At some point in time she had been on Coumadin, but is not totally clear on why this was discontinued.  She was currently just on aspirin. -Continue amiodarone  Sick sinus syndrome s/p PM: Patient has a Biotronik  dual-chamber pacemaker in place.  Device last checked on 5/12.  Chronic kidney disease stage IV: Stable.  On admission creatinine noted to be 2.41 with BUN 34 which appears around patient's baseline -Continue to monitor kidney function with diuresis  Hyperlipidemia -Continue pravastatin  DVT prophylaxis: SCDs Code Status: Full Family Communication: None requested Disposition Plan: TBD  Consults called: OB/GYN Admission status: Observation  Norval Morton MD [facility]   If 7PM-7AM, please contact night-coverage   02/15/2021, 9:26 AM

## 2021-02-15 NOTE — Progress Notes (Addendum)
NEW ADMISSION NOTE  Arrival Method: bed Mental Orientation: Alert and orientedx3 Telemetry: yes Assessment: Completed Skin: see notes Iv: Left Antecubital Pain: 0 Tubes: 0 Safety Measures: Safety Fall Prevention Plan has been given, discussed and signed Admission: Completed [facility] Orientation: Patient has been orientated to the room, unit and staff.  Family: 0  Orders have been reviewed and implemented. Will continue to monitor the patient. Call light has been placed within reach and bed alarm has been activated. Patient unable to answer complete admitting documentation due to confusion  Beatris Ship, RN

## 2021-02-15 NOTE — ED Triage Notes (Signed)
Pt c/o lower abdominal pain for the past month with nausea and pressure upon urination. Hx of kidney stones. Also c/o decreased appetite, pain worse with ambulation.

## 2021-02-15 NOTE — ED Notes (Signed)
Pt gets significantly short of breath with ambulation.

## 2021-02-15 NOTE — ED Notes (Signed)
Report given to [facility]. 

## 2021-02-15 NOTE — ED Notes (Signed)
ED Provider at bedside. 

## 2021-02-16 ENCOUNTER — Observation Stay (HOSPITAL_COMMUNITY): Payer: Medicare Other

## 2021-02-16 DIAGNOSIS — Z880 Allergy status to penicillin: Secondary | ICD-10-CM | POA: Diagnosis not present

## 2021-02-16 DIAGNOSIS — I160 Hypertensive urgency: Secondary | ICD-10-CM | POA: Diagnosis present

## 2021-02-16 DIAGNOSIS — Z20822 Contact with and (suspected) exposure to covid-19: Secondary | ICD-10-CM | POA: Diagnosis present

## 2021-02-16 DIAGNOSIS — I2510 Atherosclerotic heart disease of native coronary artery without angina pectoris: Secondary | ICD-10-CM | POA: Diagnosis present

## 2021-02-16 DIAGNOSIS — D649 Anemia, unspecified: Secondary | ICD-10-CM

## 2021-02-16 DIAGNOSIS — Z950 Presence of cardiac pacemaker: Secondary | ICD-10-CM | POA: Diagnosis not present

## 2021-02-16 DIAGNOSIS — D631 Anemia in chronic kidney disease: Secondary | ICD-10-CM | POA: Diagnosis present

## 2021-02-16 DIAGNOSIS — I5031 Acute diastolic (congestive) heart failure: Secondary | ICD-10-CM | POA: Diagnosis not present

## 2021-02-16 DIAGNOSIS — I5033 Acute on chronic diastolic (congestive) heart failure: Secondary | ICD-10-CM | POA: Diagnosis present

## 2021-02-16 DIAGNOSIS — E876 Hypokalemia: Secondary | ICD-10-CM | POA: Diagnosis present

## 2021-02-16 DIAGNOSIS — I495 Sick sinus syndrome: Secondary | ICD-10-CM | POA: Diagnosis present

## 2021-02-16 DIAGNOSIS — R918 Other nonspecific abnormal finding of lung field: Secondary | ICD-10-CM | POA: Diagnosis present

## 2021-02-16 DIAGNOSIS — Z79899 Other long term (current) drug therapy: Secondary | ICD-10-CM | POA: Diagnosis not present

## 2021-02-16 DIAGNOSIS — Z7982 Long term (current) use of aspirin: Secondary | ICD-10-CM | POA: Diagnosis not present

## 2021-02-16 DIAGNOSIS — M109 Gout, unspecified: Secondary | ICD-10-CM | POA: Diagnosis present

## 2021-02-16 DIAGNOSIS — N184 Chronic kidney disease, stage 4 (severe): Secondary | ICD-10-CM | POA: Diagnosis present

## 2021-02-16 DIAGNOSIS — I480 Paroxysmal atrial fibrillation: Secondary | ICD-10-CM | POA: Diagnosis present

## 2021-02-16 DIAGNOSIS — I371 Nonrheumatic pulmonary valve insufficiency: Secondary | ICD-10-CM | POA: Diagnosis not present

## 2021-02-16 DIAGNOSIS — Z87442 Personal history of urinary calculi: Secondary | ICD-10-CM | POA: Diagnosis not present

## 2021-02-16 DIAGNOSIS — I071 Rheumatic tricuspid insufficiency: Secondary | ICD-10-CM | POA: Diagnosis not present

## 2021-02-16 DIAGNOSIS — Z888 Allergy status to other drugs, medicaments and biological substances status: Secondary | ICD-10-CM | POA: Diagnosis not present

## 2021-02-16 DIAGNOSIS — I130 Hypertensive heart and chronic kidney disease with heart failure and stage 1 through stage 4 chronic kidney disease, or unspecified chronic kidney disease: Secondary | ICD-10-CM | POA: Diagnosis present

## 2021-02-16 DIAGNOSIS — Z96653 Presence of artificial knee joint, bilateral: Secondary | ICD-10-CM | POA: Diagnosis present

## 2021-02-16 DIAGNOSIS — Z7901 Long term (current) use of anticoagulants: Secondary | ICD-10-CM | POA: Diagnosis not present

## 2021-02-16 DIAGNOSIS — E785 Hyperlipidemia, unspecified: Secondary | ICD-10-CM | POA: Diagnosis present

## 2021-02-16 LAB — CBC
HCT: 25.9 % — ABNORMAL LOW (ref 36.0–46.0)
Hemoglobin: 8.3 g/dL — ABNORMAL LOW (ref 12.0–15.0)
MCH: 29.2 pg (ref 26.0–34.0)
MCHC: 32 g/dL (ref 30.0–36.0)
MCV: 91.2 fL (ref 80.0–100.0)
Platelets: 153 10*3/uL (ref 150–400)
RBC: 2.84 MIL/uL — ABNORMAL LOW (ref 3.87–5.11)
RDW: 17 % — ABNORMAL HIGH (ref 11.5–15.5)
WBC: 8.9 10*3/uL (ref 4.0–10.5)
nRBC: 0 % (ref 0.0–0.2)

## 2021-02-16 LAB — BPAM RBC
Blood Product Expiration Date: 202206132359
ISSUE DATE / TIME: 202205291537
Unit Type and Rh: 7300

## 2021-02-16 LAB — TYPE AND SCREEN
ABO/RH(D): B POS
Antibody Screen: NEGATIVE
Unit division: 0

## 2021-02-16 LAB — BASIC METABOLIC PANEL
Anion gap: 8 (ref 5–15)
BUN: 30 mg/dL — ABNORMAL HIGH (ref 8–23)
CO2: 26 mmol/L (ref 22–32)
Calcium: 8.5 mg/dL — ABNORMAL LOW (ref 8.9–10.3)
Chloride: 106 mmol/L (ref 98–111)
Creatinine, Ser: 2.41 mg/dL — ABNORMAL HIGH (ref 0.44–1.00)
GFR, Estimated: 19 mL/min — ABNORMAL LOW (ref >60)
Glucose, Bld: 84 mg/dL (ref 70–99)
Potassium: 4.1 mmol/L (ref 3.5–5.1)
Sodium: 140 mmol/L (ref 135–145)

## 2021-02-16 LAB — ECHOCARDIOGRAM LIMITED
AR max vel: 0.94 cm2
AV Area VTI: 0.73 cm2
AV Area mean vel: 0.93 cm2
AV Mean grad: 14 mmHg
AV Peak grad: 24.8 mmHg
Ao pk vel: 2.49 m/s
Height: 65 in
S' Lateral: 2.2 cm
Weight: 2483.26 oz

## 2021-02-16 MED ORDER — FUROSEMIDE 10 MG/ML IJ SOLN
40.0000 mg | Freq: Two times a day (BID) | INTRAMUSCULAR | Status: DC
  Administered 2021-02-16: 40 mg via INTRAVENOUS
  Filled 2021-02-16: qty 4

## 2021-02-16 NOTE — Hospital Course (Signed)
HPI on Admission: "[name] is a 85 y.o. female with medical history significant of hypertension, CAD, PAF on Coumadin, SSS s/p PM, nephrolithiasis, anemia, CKD stage IV, and gout presents with complaints of lower abdominal pain.  Patient reports he feels like something is dropping out of her stomach and has been going on for 3 months.  Pain is present when she bears down and is worse with walking worsen her to walk bowlegged.  Associated symptoms include dysuria she had been evaluated by urology and they had checked out her bladder stating everything was okay.  Over the last week she reports that she has been feeling more short of breath and thinks she is retaining fluid because her legs are swelling.  Her doctor had recently advised her to start taking torsemide 10 mg 3 times a week denies any significant fever, cough, chest pain, nausea, vomiting, diarrhea, or blood in stool.  She had recently gotten back a positive Cologuard and was set up to have a consult for colonoscopy on 31st of this month with [facility].  She is already followed by hematology as well for her anemia and receives outpatient infusions.  Her last colonoscopy was several years ago and she cannot recall if there is anything abnormal. Patient with prior history of appendectomy and hysterectomy.   ED Course: Upon admission into the emergency department patient was seen to be afebrile with respirations 19-24, blood pressures elevated to 203/83, and other vital signs maintained.  Labs significant for hemoglobin 7.4, potassium 3.4, BUN 34, creatinine 2.41, calcium 8.5, albumin 2.7, and lipase 24.  CT scan of the abdomen and pelvis noted pulmonary nodules, but no acute abnormality was found to cause patient's abdominal pain.  A CT scan of her chest was also performed which noted multiple pulmonary nodules concerning for possible infectious or inflammatory process.  Patient had been given fentanyl and Zofran.  TRH  called to admit."  Seen by gynecology who recommended outpatient follow up for more thorough exam and possible pelvic ultrasound, but did not feel symptoms related to any acute issue or bladder or vaginal vault prolapse.    Undergoing IV diuresis for acute on chronic diastolic CHF with fluid retention evidenced by pitting edema, elevated BNP.

## 2021-02-16 NOTE — Progress Notes (Signed)
  Echocardiogram 2D Echocardiogram has been performed.  Merrie Roof F 02/16/2021, 12:28 PM

## 2021-02-16 NOTE — TOC Initial Note (Addendum)
Transition of Care [facility]) - Initial/Assessment Note    Patient Details  Name: [name] MRN: [id number] Date of Birth: 01-21-1936  Transition of Care [facility]) CM/SW Contact:    Bartholomew Crews, RN Phone Number: [phone] 02/16/2021, 3:44 PM  Clinical Narrative:                  Spoke with patient at the bedside. PTA home alone. Discussed recommendation for [facility] PT. Patient agreeable. Discussed choice of agency. Referral accepted by [facility]. Patient will need HH PT order with [facility] at discharge. TOC following for transition needs.   Expected Discharge Plan: [place] Barriers to Discharge: Continued Medical Work up   Patient Goals and CMS Choice Patient states their goals for this hospitalization and ongoing recovery are:: return home CMS Medicare.gov Compare Post Acute Care list provided to:: Patient Choice offered to / list presented to : Patient  Expected Discharge Plan and Services Expected Discharge Plan: [place]   Discharge Planning Services: CM Consult Post Acute Care Choice: [place] arrangements for the past 2 months: Apartment                 DME Arranged: N/A DME Agency: NA       HH Arranged: PT HH Agency: [place] Date [place]: 02/16/21 Time HH Agency Contacted: 72 Representative spoke with at [place]: Amy  Prior Living Arrangements/Services Living arrangements for the past 2 months: Apartment Lives with:: Self Patient language and need for interpreter reviewed:: Yes Do you feel safe going back to the place where you live?: Yes      Need for Family Participation in Patient Care: No (Comment)     Criminal Activity/Legal Involvement Pertinent to Current Situation/Hospitalization: No - Comment as needed  Activities of Daily Living      Permission Sought/Granted                  Emotional Assessment Appearance:: Appears stated age Attitude/Demeanor/Rapport:  Engaged Affect (typically observed): Accepting Orientation: : Oriented to Self,Oriented to  Time,Oriented to Place,Oriented to Situation Alcohol / Substance Use: Not Applicable Psych Involvement: No (comment)  Admission diagnosis:  Anemia [D64.9] Acute on chronic anemia [D64.9] Patient Active Problem List   Diagnosis Date Noted  . Acute on chronic anemia 02/16/2021  . Anemia 02/15/2021  . Hypertensive urgency 02/15/2021  . Hypokalemia 02/15/2021  . CKD (chronic kidney disease), stage IV ([place]) 02/15/2021  . Acute diastolic CHF (congestive heart failure) ([place]) 02/15/2021  . CKD (chronic kidney disease) stage 3, GFR 30-59 ml/min (HCC) 10/17/2013  . Elevated liver enzymes 10/16/2013  . Abdominal pain, epigastric 10/16/2013  . Acute on chronic renal insufficiency 10/16/2013  . Chest pain 10/15/2013  . CAD (coronary artery disease) 10/15/2013  . Hypertension 10/15/2013  . Atrial fibrillation ([place]) 10/15/2013  . Pacemaker    PCP:  Windell Hummingbird, PA-C Pharmacy:   [facility] [address], [place] [address] Phone: [phone] Fax: [facility] [phone] - [place], [place] [address] Phone: [phone] Fax: [phone]     Social Determinants of Health (SDOH) Interventions    Readmission Risk Interventions No flowsheet data found.

## 2021-02-16 NOTE — Care Management Obs Status (Signed)
[facility] NOTIFICATION   Patient Details  Name: TALLULAH BIRDWELL MRN: [id number] Date of Birth: 01-24-1936   Medicare Observation Status Notification Given:  Yes    Bartholomew Crews, RN 02/16/2021, 1:42 PM

## 2021-02-16 NOTE — Evaluation (Signed)
Physical Therapy Evaluation Patient Details Name: [name] MRN: [id number] DOB: [birth year] Today's Date: 02/16/2021   History of Present Illness  85 y.o. female presents to ED 02/15/21 with complaints of lower abdominal pain, BP elevated 203/83, hemoglobin 7.4 CT scan of the abdomen and pelvis noted pulmonary nodules concerning for possible infectious or inflammatory process. Admitted for observation of anemia of chronic kidney disease and UTI PMH: hypertension, CAD, PAF on Coumadin, SSS s/p PM, nephrolithiasis, anemia, CKD stage IV, and gout  Clinical Impression  PTA pt living alone in single story home with level entry. Pt reports independence in household ambulation, occassional use of cane with community ambulation, independence in bADLs and iADLs, drives herself to store and appointments. Pt is limited in safe mobility today by L hip pain with ambulation, possibly due to compensatory ambulation when she was experiencing abdominal pain with ambulation prior to hospitalization. Pt also has some deficits in safety awareness and balance. Pt is currently mod I for bed mobility, min guard for transfers and min A for 1x LoB with ambulation. PT recommending intermittent assist from son and niece who live down the street as well as HHPT for improved gait and balance in her home environment. PT will continue to follow pt acutely.     Follow Up Recommendations Home health PT;Supervision for mobility/OOB    Equipment Recommendations  None recommended by PT (has necessary equipment)       Precautions / Restrictions Precautions Precautions: Fall Precaution Comments: fell last month when knee gave out Restrictions Weight Bearing Restrictions: No      Mobility  Bed Mobility Overal bed mobility: Modified Independent             General bed mobility comments: hoB elevated    Transfers Overall transfer level: Needs assistance Equipment used: None Transfers: Sit to/from Stand            General transfer comment: bed elevated, good power up and self steadying with reaching for footboard, decreased eccentric control with descent to recliner, vc for reaching back to chair and controlling descent  Ambulation/Gait Ambulation/Gait assistance: Min guard;Min assist Gait Distance (Feet): 40 Feet Assistive device: Rolling walker (2 wheeled) Gait Pattern/deviations: Step-through pattern;Decreased step length - right;Decreased stance time - left;Decreased weight shift to left;Shuffle;Trunk flexed Gait velocity: slowed Gait velocity interpretation: <1.31 ft/sec, indicative of household ambulator General Gait Details: requested RW for ambulation, pt experiencing L hip pain with weightbearing during ambulation, pt endorses modified gait pattern with prior abdominal pain with ambulation, 1x LoB requiring min A for steadying when navigating around foot of bed with return to room      Balance Overall balance assessment: Mild deficits observed, not formally tested                                           Pertinent Vitals/Pain Pain Assessment: No/denies pain    Home Living Family/patient expects to be discharged to:: Private residence Living Arrangements: Alone Available Help at Discharge: Family;Available 24 hours/day Type of Home: House Home Access: Level entry     Home Layout: One level Home Equipment: Tub bench;Hand held shower head;Toilet riser;Walker - 4 wheels;Walker - 2 wheels;Cane - single point      Prior Function Level of Independence: Independent         Comments: drives        Extremity/Trunk Assessment   Upper  Extremity Assessment Upper Extremity Assessment: Overall WFL for tasks assessed    Lower Extremity Assessment Lower Extremity Assessment: RLE deficits/detail;LLE deficits/detail RLE Deficits / Details: hx TKA lacking full knee flex/ext, strength grossly 3+/5 LLE Deficits / Details: hx TKA lacking full knee flex/ext, strength  grossly 3+/5       Communication   Communication: No difficulties  Cognition Arousal/Alertness: Awake/alert Behavior During Therapy: WFL for tasks assessed/performed Overall Cognitive Status: Within Functional Limits for tasks assessed                                        General Comments General comments (skin integrity, edema, etc.): VSS on RA        Assessment/Plan    PT Assessment Patient needs continued PT services  PT Problem List Decreased activity tolerance;Decreased mobility;Decreased balance;Pain       PT Treatment Interventions DME instruction;Gait training;Functional mobility training;Therapeutic activities;Therapeutic exercise;Balance training;Cognitive remediation;Patient/family education    PT Goals (Current goals can be found in the Care Plan section)  Acute Rehab PT Goals Patient Stated Goal: keep her independence PT Goal Formulation: With patient Time For Goal Achievement: 03/02/21 Potential to Achieve Goals: Good    Frequency Min 3X/week   Barriers to discharge Decreased caregiver support         AM-PAC PT "6 Clicks" Mobility  Outcome Measure Help needed turning from your back to your side while in a flat bed without using bedrails?: None Help needed moving from lying on your back to sitting on the side of a flat bed without using bedrails?: None Help needed moving to and from a bed to a chair (including a wheelchair)?: None Help needed standing up from a chair using your arms (e.g., wheelchair or bedside chair)?: None Help needed to walk in hospital room?: A Little Help needed climbing 3-5 steps with a railing? : A Little 6 Click Score: 22    End of Session Equipment Utilized During Treatment: Gait belt Activity Tolerance: Patient limited by pain (L hip pain with ambulation) Patient left: in chair;with call bell/phone within reach;with chair alarm set Nurse Communication: Mobility status;Patient requests pain meds PT Visit  Diagnosis: Unsteadiness on feet (R26.81);Other abnormalities of gait and mobility (R26.89);Pain;Muscle weakness (generalized) (M62.81);History of falling (Z91.81) Pain - Right/Left: Left Pain - part of body: Hip    Time: [id number] PT Time Calculation (min) (ACUTE ONLY): 31 min   Charges:   PT Evaluation $PT Eval Moderate Complexity: 1 Mod PT Treatments $Gait Training: 8-22 mins        Dominyck Reser B. Migdalia Dk PT, DPT Acute Rehabilitation Services Pager [phone] Office [phone]   [place] 02/16/2021, 9:46 AM

## 2021-02-16 NOTE — Progress Notes (Signed)
PROGRESS NOTE    [name]   ALP:[id number]  DOB: 04/30/1936  PCP: Windell Hummingbird, PA-C    DOA: 02/15/2021 LOS: 0   Brief Narrative   HPI on Admission: "[name] is a 85 y.o. female with medical history significant of hypertension, CAD, PAF on Coumadin, SSS s/p PM, nephrolithiasis, anemia, CKD stage IV, and gout presents with complaints of lower abdominal pain.  Patient reports he feels like something is dropping out of her stomach and has been going on for 3 months.  Pain is present when she bears down and is worse with walking worsen her to walk bowlegged.  Associated symptoms include dysuria she had been evaluated by urology and they had checked out her bladder stating everything was okay.  Over the last week she reports that she has been feeling more short of breath and thinks she is retaining fluid because her legs are swelling.  Her doctor had recently advised her to start taking torsemide 10 mg 3 times a week denies any significant fever, cough, chest pain, nausea, vomiting, diarrhea, or blood in stool.  She had recently gotten back a positive Cologuard and was set up to have a consult for colonoscopy on 31st of this month with [facility].  She is already followed by hematology as well for her anemia and receives outpatient infusions.  Her last colonoscopy was several years ago and she cannot recall if there is anything abnormal. Patient with prior history of appendectomy and hysterectomy.   ED Course: Upon admission into the emergency department patient was seen to be afebrile with respirations 19-24, blood pressures elevated to 203/83, and other vital signs maintained.  Labs significant for hemoglobin 7.4, potassium 3.4, BUN 34, creatinine 2.41, calcium 8.5, albumin 2.7, and lipase 24.  CT scan of the abdomen and pelvis noted pulmonary nodules, but no acute abnormality was found to cause patient's abdominal pain.  A CT scan of her chest was also performed which  noted multiple pulmonary nodules concerning for possible infectious or inflammatory process.  Patient had been given fentanyl and Zofran.  TRH called to admit."  Seen by gynecology who recommended outpatient follow up for more thorough exam and possible pelvic ultrasound, but did not feel symptoms related to any acute issue or bladder or vaginal vault prolapse.    Undergoing IV diuresis for acute on chronic diastolic CHF with fluid retention evidenced by pitting edema, elevated BNP.    Assessment & Plan   Principal Problem:   Anemia Active Problems:   Pacemaker   Atrial fibrillation ([place])   Hypertensive urgency   Hypokalemia   CKD (chronic kidney disease), stage IV (HCC)   Acute diastolic CHF (congestive heart failure) (HCC)  Acute on chronic diastolic CHF - presented with SOB, fluid retention subjectively, with exam notable for 1+ pitting edema. BNP elevated at 750.9.    Last echocardiogram 03/2020 EF of 55 to 60% with mild aortic stenosis and mild tricuspid regurgitation. -Strict I&Os -Daily weights -Follow up pending echocardiogram -Held oral torosemide -Continue Lasix 40 mg IV twice daily - likely transition back to PO diuretic tomorrow Responding well to diuresis.  Anemia of chronic kidney disease: p/w complaints of dyspnea and was found to have hemoglobin of 7.4, and last had been 8.1 approximately 1 month ago.  Followed by hematology and receives Aranesp infusions, last was on 4/12.   Had positive Cologuard screening test and had been scheduled for outpatient colonoscopy with [facility] on 5/31.  Appears to be on aspirin, no longer on Coumadin  Transfused 1 unit PRBC's on admission. -anemia panel checked -Check INR -Continue to monitor H&H, transfuse if bleeding for Hbg<7.0 -Discussed with [facility] who recommend patient to keep outpatient follow-up appointment unless patient found to have gross signs of bleeding  Abdominal pain: Patient main complaint is of lower  abdominal pain with walking.  Lipase and LFTs within normal limits.  CT scan of the abdomen pelvis without any clear findings that would correlate to patient's symptoms.  Urinalysis showed significant signs of a UTI.  Question issue with her pelvic floor. -OB/GYN consulted - outpatient followup 5/30: symptoms improved, occur intermittent  Hypertensive urgency: Acute. On admission blood pressures elevated to 230/83.  She had missed all of her AM medications.  Home regimen includes hydralazine 100 mg every morning /50 mg daily at bedtime, diastolic 10 mg daily, Micardis 40 mg daily -Resumed on home regimen   Dyspnea pulmonary nodules: Patient reported having shortness of breath with exertion - could be related to both anemia and/or fluid overload / CHF.  Multiple pulmonary nodules on CT scan of the chest.  No prior history of tobacco use.   Reports seeing a Dr. Gwenevere Ghazi of pulmonology at [facility].   -Check ESR and CRP -Continue Singulair with albuterol PRN -Outpatient follow-up with Dr. Verdie Mosher  Hypokalemia: Acute.  Potassium 3.4 on admission, replaced. -Monitor BMP and replace as needed.  Paroxysmal atrial fibrillation on Coumadin: In sinus rhythm on admission.  At some point in time she had been on Coumadin, but is not totally clear on why this was discontinued.  PTA was just on aspirin. -Continue amiodarone -Telemetry  Sick sinus syndrome s/p PM: Patient has a Biotronik dual-chamber pacemaker in place.  Device last checked on 5/12.  CKD stage IV - Stable.  Creatinine 2.41 BUN 34 on admission, appears near baseline -Continue to monitor kidney function with diuresis  Hyperlipidemia -Continue pravastatin   Patient BMI: Body mass index is 25.83 kg/m.   DVT prophylaxis: SCDs Start: 02/15/21 1257   Diet:  Diet Orders (From admission, onward)    Start     Ordered   02/16/21 1008  Diet Heart Room service appropriate? Yes; Fluid consistency: Thin  Diet effective  now       Question Answer Comment  Room service appropriate? Yes   Fluid consistency: Thin      02/16/21 1007            Code Status: Full Code    Subjective 02/16/21    Pt seen today and reports feeling better.  Leg swelling is getting better.  Felt a bit SOB with PT.  Says PT said her belly pain with walking is probably her hip.     Disposition Plan & Communication   Status is: Inpatient  Remains inpatient appropriate because:IV treatments appropriate due to intensity of illness or inability to take PO. Patient requires further IV diuresis due to ongoing dyspnea exertion with peripheral edema.   Dispo: The patient is from: Home              Anticipated d/c is to: Home              Patient currently is not medically stable to d/c.   Difficult to place patient No   Consults, Procedures, Significant Events   Consultants:   OB/GYN  Procedures:   None  Antimicrobials:  Anti-infectives (From admission, onward)   None  Micro    Objective   Vitals:   02/15/21 1958 02/16/21 0541 02/16/21 0546 02/16/21 0951  BP: (!) 184/93 (!) 165/85  (!) 169/88  Pulse: 91 89  90  Resp: _0 Temp: 99.1 F (37.3 C) 99.2 F (37.3 C)  98.4 F (36.9 C)  TempSrc: Oral Oral  Oral  SpO2: 96% 96%  100%  Weight:   70.4 kg   Height:        Intake/Output Summary (Last 24 hours) at 02/16/2021 1504 Last data filed at 02/16/2021 1500 Gross per 24 hour  Intake 1438 ml  Output 2200 ml  Net -762 ml   Filed Weights   02/15/21 0734 02/16/21 0546  Weight: 73.9 kg 70.4 kg    Physical Exam:  General exam: awake, alert, no acute distress HEENT: moist mucus membranes, hearing grossly normal  Respiratory system: Faint basilar crackles, no wheezes, normal respiratory effort. Cardiovascular system: normal S1/S2, RRR, trace to 1+ lower extremity edema.   Gastrointestinal system: soft, NT, ND, no HSM felt, +bowel sounds. Central nervous system: A&O x3. no gross focal  neurologic deficits, normal speech Skin: dry, intact, normal temperature Psychiatry: normal mood, congruent affect, judgement and insight appear normal  Labs   Data Reviewed: I have personally reviewed following labs and imaging studies  CBC: Recent Labs  Lab 02/15/21 0804 02/15/21 1308 02/16/21 0206  WBC 7.2  --  8.9  NEUTROABS 5.8  --   --   HGB 7.4* 7.9* 8.3*  HCT 23.1* 24.9* 25.9*  MCV 93.9  --  91.2  PLT 155  --  768   Basic Metabolic Panel: Recent Labs  Lab 02/15/21 0804 02/16/21 0206  NA 143 140  K 3.4* 4.1  CL 110 106  CO2 27 26  GLUCOSE 100* 84  BUN 34* 30*  CREATININE 2.41* 2.41*  CALCIUM 8.5* 8.5*   GFR: Estimated Creatinine Clearance: 17.4 mL/min (A) (by C-G formula based on SCr of 2.41 mg/dL (H)). Liver Function Tests: Recent Labs  Lab 02/15/21 0804  AST 16  ALT 10  ALKPHOS 63  BILITOT 0.5  PROT 6.3*  ALBUMIN 2.7*   Recent Labs  Lab 02/15/21 0804  LIPASE 24   No results for input(s): AMMONIA in the last 168 hours. Coagulation Profile: Recent Labs  Lab 02/15/21 1432  INR 1.1   Cardiac Enzymes: No results for input(s): CKTOTAL, CKMB, CKMBINDEX, TROPONINI in the last 168 hours. BNP (last 3 results) No results for input(s): PROBNP in the last 8760 hours. HbA1C: No results for input(s): HGBA1C in the last 72 hours. CBG: No results for input(s): GLUCAP in the last 168 hours. Lipid Profile: No results for input(s): CHOL, HDL, LDLCALC, TRIG, CHOLHDL, LDLDIRECT in the last 72 hours. Thyroid Function Tests: No results for input(s): TSH, T4TOTAL, FREET4, T3FREE, THYROIDAB in the last 72 hours. Anemia Panel: Recent Labs    02/15/21 0912 02/15/21 1308  VITAMINB12 216 236  FOLATE 9.0 10.5  FERRITIN 131 129  TIBC 210* 220*  IRON 22* 23*  RETICCTPCT 1.6 1.6   Sepsis Labs: No results for input(s): PROCALCITON, LATICACIDVEN in the last 168 hours.  Recent Results (from the past 240 hour(s))  Resp Panel by RT-PCR (Flu A&B, Covid)  Nasopharyngeal Swab     Status: None   Collection Time: 02/15/21  9:12 AM   Specimen: Nasopharyngeal Swab; Nasopharyngeal(NP) swabs in vial transport medium  Result Value Ref Range Status   SARS Coronavirus 2 by RT PCR NEGATIVE NEGATIVE Final  Comment: (NOTE) SARS-CoV-2 target nucleic acids are NOT DETECTED.  The SARS-CoV-2 RNA is generally detectable in upper respiratory specimens during the acute phase of infection. The lowest concentration of SARS-CoV-2 viral copies this assay can detect is 138 copies/mL. A negative result does not preclude SARS-Cov-2 infection and should not be used as the sole basis for treatment or other patient management decisions. A negative result may occur with  improper specimen collection/handling, submission of specimen other than nasopharyngeal swab, presence of viral mutation(s) within the areas targeted by this assay, and inadequate number of viral copies(<138 copies/mL). A negative result must be combined with clinical observations, patient history, and epidemiological information. The expected result is Negative.  Fact Sheet for Patients:  EntrepreneurPulse.com.au  Fact Sheet for Healthcare Providers:  IncredibleEmployment.be  This test is no t yet approved or cleared by the [place] FDA and  has been authorized for detection and/or diagnosis of SARS-CoV-2 by FDA under an Emergency Use Authorization (EUA). This EUA will remain  in effect (meaning this test can be used) for the duration of the COVID-19 declaration under Section 564(b)(1) of the Act, 21 U.S.C.section 360bbb-3(b)(1), unless the authorization is terminated  or revoked sooner.       Influenza A by PCR NEGATIVE NEGATIVE Final   Influenza B by PCR NEGATIVE NEGATIVE Final    Comment: (NOTE) The Xpert Xpress SARS-CoV-2/FLU/RSV plus assay is intended as an aid in the diagnosis of influenza from Nasopharyngeal swab specimens and should not be  used as a sole basis for treatment. Nasal washings and aspirates are unacceptable for Xpert Xpress SARS-CoV-2/FLU/RSV testing.  Fact Sheet for Patients: EntrepreneurPulse.com.au  Fact Sheet for Healthcare Providers: IncredibleEmployment.be  This test is not yet approved or cleared by the [place] FDA and has been authorized for detection and/or diagnosis of SARS-CoV-2 by FDA under an Emergency Use Authorization (EUA). This EUA will remain in effect (meaning this test can be used) for the duration of the COVID-19 declaration under Section 564(b)(1) of the Act, 21 U.S.C. section 360bbb-3(b)(1), unless the authorization is terminated or revoked.  Performed at [facility], [address], [place], [place] [address]       Imaging Studies   CT Chest Wo Contrast  Result Date: 02/15/2021 CLINICAL DATA:  Chest pain or shortness of breath. Evaluate for metastatic disease EXAM: CT CHEST WITHOUT CONTRAST TECHNIQUE: Multidetector CT imaging of the chest was performed following the standard protocol without IV contrast. COMPARISON:  10/15/2013 FINDINGS: Cardiovascular: Normal heart size. Dual-chamber pacer leads present. No pericardial effusion. Extensive atheromatous calcification of the aorta and coronaries. Mediastinum/Nodes: Negative for adenopathy or mass. Lungs/Pleura: Multiple irregular pulmonary nodules which are seemingly random in distribution. Largest is in the lingula at 16 mm. No edema, effusion, or pneumothorax. Subpleural scarring in the right lower lobe associated with thoracic osteophytes. Upper Abdomen: Reported separately Musculoskeletal: No acute finding IMPRESSION: 1. Multiple pulmonary nodules with irregular appearance favoring infectious/inflammatory process over metastatic disease. Recommend chest CT follow-up in 2 months to differentiate. 2. Aortic and coronary atherosclerosis. Electronically Signed   By: Monte Fantasia M.D.    On: 02/15/2021 09:54   CT Renal Stone Study  Result Date: 02/15/2021 CLINICAL DATA:  85 year old female with history of left-sided flank pain and left lower quadrant abdominal pain. Suspected kidney stone. Potential diverticulitis. EXAM: CT ABDOMEN AND PELVIS WITHOUT CONTRAST TECHNIQUE: Multidetector CT imaging of the abdomen and pelvis was performed following the standard protocol without IV contrast. COMPARISON:  CT the abdomen  and pelvis 06/06/2012. FINDINGS: Lower chest: Multiple pulmonary nodules noted in the visualize lung bases measuring up to 1.6 x 1.1 cm in the left upper lobe posteriorly (axial image 3 of series 4). Atherosclerotic calcifications in the thoracic aorta as well as the left anterior descending and left circumflex coronary arteries. Calcifications of the aortic valve. Pacemaker leads terminating in the right atrium and right ventricular apex. Hepatobiliary: Multiple tiny calcified granulomas in the lungs bilaterally. Liver has a slightly shrunken appearance and nodular contour, suggesting mild cirrhosis. Subcentimeter low-attenuation lesion in the right lobe of the liver (axial image 26 of series 2), incompletely characterized on today's non-contrast CT examination, but statistically likely to represent a cyst. No other larger more concerning liver lesions are confidently identified on today's noncontrast CT examination. Status post cholecystectomy. Pancreas: No definite pancreatic mass or peripancreatic fluid collections or inflammatory changes are confidently identified on today's noncontrast CT examination. Spleen: Unremarkable. Adrenals/Urinary Tract: No calcifications are identified within the collecting system of either kidney, along the course of either ureter, or within the lumen of the urinary bladder. No hydroureteronephrosis. In the lower pole of the right kidney there is a 1.8 x 1.1 cm low-attenuation lesion which is incompletely characterized on today's non-contrast CT  examination, but statistically likely to represent a tiny cyst. Multifocal cortical scarring in the left kidney. Bilateral adrenal glands are normal in appearance. Unenhanced appearance of the urinary bladder is normal. Stomach/Bowel: Suture line in the proximal stomach. No pathologic dilatation of small bowel or colon. Normal appendix. Vascular/Lymphatic: Aortic atherosclerosis. No lymphadenopathy noted in the abdomen or pelvis. Reproductive: Status post hysterectomy. Ovaries are unremarkable in appearance. Other: No significant volume of ascites.  No pneumoperitoneum. Musculoskeletal: There are no aggressive appearing lytic or blastic lesions noted in the visualized portions of the skeleton. IMPRESSION: 1. No acute findings are noted in the abdomen or pelvis to account for the patient's symptoms. Specifically, no urinary tract calculi no findings of urinary tract obstruction. Additionally, there is no evidence of acute diverticulitis. 2. Multiple pulmonary nodules noted throughout the visualize lung bases measuring up to 1.6 x 1.1 cm in the inferior aspect of the left upper lobe. The possibility of metastatic disease to the lungs should be considered. Further evaluation with noncontrast chest CT is recommended at this time to establish a baseline for future follow-up examinations. 3. Aortic atherosclerosis, in addition to two vessel coronary artery disease. 4. There are calcifications of the aortic valve. Echocardiographic correlation for evaluation of potential valvular dysfunction may be warranted if clinically indicated. 5. Additional incidental findings, as above. Electronically Signed   By: Vinnie Langton M.D.   On: 02/15/2021 08:32   ECHOCARDIOGRAM LIMITED  Result Date: 02/16/2021    ECHOCARDIOGRAM LIMITED REPORT   Patient Name:   LAPORCHE MARTELLE Date of Exam: 02/16/2021 Medical Rec #:  [id number]   Height:       65.0 in Accession #:    [id number]  Weight:       155.2 lb Date of Birth:  09/04/1936   BSA:           1.776 m Patient Age:    54 years    BP:           169/88 mmHg Patient Gender: F           HR:           90 bpm. Exam Location:  Inpatient Procedure: 2D Echo, Cardiac Doppler, Color Doppler and Limited Echo Indications:  K82.06 Acute diastolic (congestive) heart failure  History:        Patient has no prior history of Echocardiogram examinations.                 Pacemaker. Patient came to the hospital with abdominal pain.                 Patient normally sees cardiology with [facility]:    [place] Referring Phys: [id number] [place]  1. Left ventricular ejection fraction, by estimation, is 60 to 65%. The left ventricle has normal function. The left ventricle has no regional wall motion abnormalities. There is mild left ventricular hypertrophy.  2. Right ventricular systolic function is normal. The right ventricular size is moderately enlarged.  3. Left atrial size was moderately dilated.  4. Right atrial size was mildly dilated.  5. The mitral valve is normal in structure. No evidence of mitral valve regurgitation. No evidence of mitral stenosis.  6. Tricuspid valve regurgitation is moderate.  7. The aortic valve is normal in structure. There is moderate calcification of the aortic valve. There is moderate thickening of the aortic valve. Aortic valve regurgitation is not visualized. Mild aortic valve stenosis. Aortic valve Vmax measures 2.49 m/s.  8. Pulmonic valve regurgitation is moderate.  9. The inferior vena cava is normal in size with greater than 50% respiratory variability, suggesting right atrial pressure of 3 mmHg. FINDINGS  Left Ventricle: Left ventricular ejection fraction, by estimation, is 60 to 65%. The left ventricle has normal function. The left ventricle has no regional wall motion abnormalities. The left ventricular internal cavity size was normal in size. There is  mild left ventricular hypertrophy. Right Ventricle: The  right ventricular size is moderately enlarged. No increase in right ventricular wall thickness. Right ventricular systolic function is normal. Left Atrium: Left atrial size was moderately dilated. Right Atrium: Right atrial size was mildly dilated. Pericardium: There is no evidence of pericardial effusion. Mitral Valve: The mitral valve is normal in structure. No evidence of mitral valve stenosis. Tricuspid Valve: The tricuspid valve is normal in structure. Tricuspid valve regurgitation is moderate . No evidence of tricuspid stenosis. Aortic Valve: The aortic valve is normal in structure. There is moderate calcification of the aortic valve. There is moderate thickening of the aortic valve. Aortic valve regurgitation is not visualized. Mild aortic stenosis is present. Aortic valve mean  gradient measures 14.0 mmHg. Aortic valve peak gradient measures 24.8 mmHg. Aortic valve area, by VTI measures 0.73 cm. Pulmonic Valve: The pulmonic valve was normal in structure. Pulmonic valve regurgitation is moderate. No evidence of pulmonic stenosis. Aorta: The aortic root is normal in size and structure. Venous: The inferior vena cava is normal in size with greater than 50% respiratory variability, suggesting right atrial pressure of 3 mmHg. IAS/Shunts: No atrial level shunt detected by color flow Doppler. Additional Comments: A device lead is visualized. LEFT VENTRICLE PLAX 2D LVIDd:         3.30 cm LVIDs:         2.20 cm LV PW:         1.30 cm LV IVS:        1.30 cm LVOT diam:     1.90 cm LV SV:         39 LV SV Index:   22 LVOT Area:  2.84 cm  RIGHT VENTRICLE          IVC RV Basal diam:  5.10 cm  IVC diam: 2.00 cm RV Mid diam:    3.90 cm LEFT ATRIUM            Index       RIGHT ATRIUM           Index LA diam:      3.60 cm  2.03 cm/m  RA Area:     16.60 cm LA Vol (A2C): 107.0 ml 60.25 ml/m RA Volume:   46.50 ml  26.18 ml/m LA Vol (A4C): 81.1 ml  45.66 ml/m  AORTIC VALVE AV Area (Vmax):    0.94 cm AV Area (Vmean):    0.93 cm AV Area (VTI):     0.73 cm AV Vmax:           249.00 cm/s AV Vmean:          180.000 cm/s AV VTI:            0.530 m AV Peak Grad:      24.8 mmHg AV Mean Grad:      14.0 mmHg LVOT Vmax:         82.80 cm/s LVOT Vmean:        59.100 cm/s LVOT VTI:          0.137 m LVOT/AV VTI ratio: 0.26  AORTA Ao Root diam: 2.80 cm TRICUSPID VALVE TR Peak grad:   42.8 mmHg TR Vmax:        327.00 cm/s  SHUNTS Systemic VTI:  0.14 m Systemic Diam: 1.90 cm Candee Furbish MD Electronically signed by Candee Furbish MD Signature Date/Time: 02/16/2021/12:53:09 PM    Final      Medications   Scheduled Meds: . amiodarone  100 mg Oral Daily  . guaiFENesin  600 mg Oral BID  . hydrALAZINE  50-100 mg Oral BID  . irbesartan  150 mg Oral Daily  . latanoprost  1 drop Both Eyes QHS  . montelukast  10 mg Oral Daily  . nebivolol  10 mg Oral q morning  . pantoprazole  40 mg Oral Daily  . pravastatin  10 mg Oral QHS  . sodium chloride flush  3 mL Intravenous Q12H   Continuous Infusions:     LOS: 0 days    Time spent: 30 minutes    Ezekiel Slocumb, DO [facility]  02/16/2021, 3:04 PM      If 7PM-7AM, please contact night-coverage. How to contact the [facility] Attending or Consulting provider [place] or covering provider during after hours [place], for this patient?    1. Check the care team in [facility] and look for a) attending/consulting [facility] provider listed and b) the [facility] team listed 2. Log into [email or web address] and use [place]'s universal password to access. If you do not have the password, please contact the hospital operator. 3. Locate the [facility] provider you are looking for under [facility] and page to a number that you can be directly reached. 4. If you still have difficulty reaching the provider, please page the [facility] (Director on Call) for the Hospitalists listed on amion for assistance.

## 2021-02-16 NOTE — Plan of Care (Signed)
  Problem: Education: Goal: Knowledge of General Education information will improve Description: Including pain rating scale, medication(s)/side effects and non-pharmacologic comfort measures Outcome: Progressing   Problem: Activity: Goal: Risk for activity intolerance will decrease Outcome: Progressing   Problem: Elimination: Goal: Will not experience complications related to bowel motility Outcome: Progressing Goal: Will not experience complications related to urinary retention Outcome: Progressing   Problem: Pain Managment: Goal: General experience of comfort will improve Outcome: Progressing   Problem: Safety: Goal: Ability to remain free from injury will improve Outcome: Progressing   

## 2021-02-17 DIAGNOSIS — R911 Solitary pulmonary nodule: Secondary | ICD-10-CM

## 2021-02-17 DIAGNOSIS — I371 Nonrheumatic pulmonary valve insufficiency: Secondary | ICD-10-CM | POA: Diagnosis not present

## 2021-02-17 DIAGNOSIS — I071 Rheumatic tricuspid insufficiency: Secondary | ICD-10-CM | POA: Diagnosis not present

## 2021-02-17 DIAGNOSIS — N189 Chronic kidney disease, unspecified: Secondary | ICD-10-CM

## 2021-02-17 DIAGNOSIS — I5033 Acute on chronic diastolic (congestive) heart failure: Secondary | ICD-10-CM | POA: Diagnosis not present

## 2021-02-17 DIAGNOSIS — R5381 Other malaise: Secondary | ICD-10-CM

## 2021-02-17 DIAGNOSIS — N184 Chronic kidney disease, stage 4 (severe): Secondary | ICD-10-CM | POA: Diagnosis not present

## 2021-02-17 LAB — CBC
HCT: 29.8 % — ABNORMAL LOW (ref 36.0–46.0)
Hemoglobin: 9.3 g/dL — ABNORMAL LOW (ref 12.0–15.0)
MCH: 28.6 pg (ref 26.0–34.0)
MCHC: 31.2 g/dL (ref 30.0–36.0)
MCV: 91.7 fL (ref 80.0–100.0)
Platelets: 161 10*3/uL (ref 150–400)
RBC: 3.25 MIL/uL — ABNORMAL LOW (ref 3.87–5.11)
RDW: 16.2 % — ABNORMAL HIGH (ref 11.5–15.5)
WBC: 9.6 10*3/uL (ref 4.0–10.5)
nRBC: 0 % (ref 0.0–0.2)

## 2021-02-17 LAB — URINE CULTURE

## 2021-02-17 LAB — BASIC METABOLIC PANEL
Anion gap: 15 (ref 5–15)
BUN: 32 mg/dL — ABNORMAL HIGH (ref 8–23)
CO2: 25 mmol/L (ref 22–32)
Calcium: 8.5 mg/dL — ABNORMAL LOW (ref 8.9–10.3)
Chloride: 98 mmol/L (ref 98–111)
Creatinine, Ser: 2.67 mg/dL — ABNORMAL HIGH (ref 0.44–1.00)
GFR, Estimated: 17 mL/min — ABNORMAL LOW (ref >60)
Glucose, Bld: 103 mg/dL — ABNORMAL HIGH (ref 70–99)
Potassium: 4.5 mmol/L (ref 3.5–5.1)
Sodium: 138 mmol/L (ref 135–145)

## 2021-02-17 LAB — MAGNESIUM: Magnesium: 1.5 mg/dL — ABNORMAL LOW (ref 1.7–2.4)

## 2021-02-17 MED ORDER — MAGNESIUM SULFATE 2 GM/50ML IV SOLN
2.0000 g | Freq: Once | INTRAVENOUS | Status: AC
  Administered 2021-02-17: 2 g via INTRAVENOUS
  Filled 2021-02-17: qty 50

## 2021-02-17 MED ORDER — TORSEMIDE 20 MG PO TABS
20.0000 mg | ORAL_TABLET | Freq: Two times a day (BID) | ORAL | Status: DC
  Administered 2021-02-17: 20 mg via ORAL
  Filled 2021-02-17 (×2): qty 1

## 2021-02-17 MED ORDER — TORSEMIDE 20 MG PO TABS: 20.0000 mg | ORAL_TABLET | Freq: Every day | ORAL | 1 refills | Status: AC

## 2021-02-17 NOTE — Discharge Summary (Signed)
Physician Discharge Summary  [name] [id number] DOB: Nov 15, 1935 DOA: 02/15/2021  PCP: Windell Hummingbird, PA-C  Admit date: 02/15/2021 Discharge date: 02/17/2021  Admitted From: Home Disposition:  home  Recommendations for Outpatient Follow-up:  1. Outpatient follow-up with PCP, cardiologist and gastroenterologist in 1 to 2 weeks 2. Outpatient follow-up with gynecologist as below 3. Patient was encouraged to follow-up with a cardiologist in 1 to 2 weeks 4. Please obtain CBC/BMP/Mag at follow up 5. Please follow up on the following pending results: None  Home Health: PT/OT Equipment/Devices: None recommended  Discharge Condition: Stable CODE STATUS: Full code   [place] Follow up in 4 week(s).   Specialty: Obstetrics and Gynecology Contact information: [place] [phone]       [facility], [facility] Follow up.   Specialty: Home Health Services Why: the home health agency now goes by the name [facility]. The office will call you to schedule home health visits.  Contact information: [place] [phone]        Windell Hummingbird, PA-C. Schedule an appointment as soon as possible for a visit in 1 week(s).   Specialty: Physician Assistant Contact information: [address] [place] [address] [phone]                Hospital Course: 85 year old F with PMH of diastolic CHF, CAD, paroxysmal A. fib not on AC, SSS s/p PPM, CKD-4, anemia of renal disease and gout presenting with intermittent lower abdominal pain for 3 months, shortness of breath, edema and some dysuria, and admitted for acute on chronic diastolic CHF, hypertensive urgency and anemia.  She was hypertensive to 203/83.  BNP elevated to 751.  High-sensitivity troponin slightly elevated to 23 and remained flat.  Hemoglobin down to 7.4.   Anemia panel with some degree of iron deficiency.  Urinalysis was not impressive.  CT chest/abdomen/pelvis without contrast with multiple pulmonary nodules with irregular appearance favoring infectious/inflammatory/overall metastatic disease.  Repeat CT was recommended in 2 months.  TTE with LVEF of 60 to 65%, moderate TVR and moderate PVR.    Patient was a started on IV Lasix with improvement in his symptoms.  She was also transfused 1 unit with appropriate response.  Patient has upcoming appointment with gastroenterologist for a follow-up on positive Cologuard.  On the day of discharge, she felt well.  Evaluated by therapy who recommended home health.  She is discharged on p.o. torsemide 20 mg daily.  Of note, she was on torsemide 10 mg MWF prior to admission.  Patient to follow-up with her cardiologist in about 1 to 2 weeks.   Outpatient follow-up with gynecology as above.  She also need follow-up with nephrology if she does not want needed.  See individual problem list below for more hospital course.  Discharge Diagnoses:  Acute on chronic diastolic CHF-likely cause of her edema, abdominal pain and shortness of breath. -P.o. torsemide 20 mg daily -Reassess fluid and renal status at follow-up -Counseled on sodium and fluid restrictions -Recheck renal function in 1 week  Valvular disease: TTE with moderate TVR and PVR.  Could be contributing to the edema and abdominal pain -Diuretics as above -Outpatient follow-up with a cardiologist  Hypertensive urgency: BP was elevated to 203/83 on admission.  Improved. -Continue home nebivolol and Mycardis  -Increased hydralazine to 50 mg 3 times daily. -P.o. torsemide as above  Anemia of chronic kidney disease: Reportedly has positive Cologuard outpatient.  Transfused 1 unit with appropriate response. Recent Labs    06/04/20 1322 02/15/21 0804 02/15/21 1308 02/16/21 0206 02/17/21 0326  HGB 9.3* 7.4* 7.9* 8.3* 9.3*  -Has upcoming appointment  with GI at [facility]. -She could benefit from ESA and IV iron -Needs outpatient follow-up with nephrology if she does not want it.  CKD-4: Stable. Recent Labs    06/04/20 1322 02/15/21 0804 02/16/21 0206 02/17/21 0326  BUN 37* 34* 30* 32*  CREATININE 2.46* 2.41* 2.41* 2.67*  -Needs outpatient follow-up with nephrology -Recheck renal function in 1 week  Paroxysmal A. fib-rate controlled.  Continue home amiodarone and nebivolol -Reportedly, she was taken off warfarin by her cardiologist. -Defer decision about anticoagulation to her cardiologist  SSS s/p PPM-stable  Abdominal pain-I suspect this to be due to his CHF with valvular disease.  CT abdomen and pelvis without acute finding.  She already has hysterectomy.  Improved with IV diuretics. -Outpatient follow-up with OB/GYN as above  Multiple pulmonary nodules: Could be due to fluid.  No history of tobacco use. -Outpatient follow-up with hepatologist at [facility] PT/OT  Hypomagnesemia -Replenished prior to discharge.  Hypokalemia -Resolved.  Family communication-updated patient's son over the phone prior to discharge  Body mass index is 26.08 kg/m.            Discharge Exam: Vitals:   02/17/21 0400 02/17/21 0941  BP: 137/67 121/78  Pulse: 88 76  Resp: 18 17  Temp: 98.6 F (37 C) 98.9 F (37.2 C)  SpO2: 98% 98%    GENERAL: No apparent distress.  Nontoxic. HEENT: MMM.  Vision and hearing grossly intact.  NECK: Supple.  Some JVD RESP: On RA.  No IWOB.  Fair aeration bilaterally. CVS:  RRR.  2/6 SEM over LLSB ABD/GI/GU: Bowel sounds present. Soft. Non tender.  MSK/EXT:  Moves extremities. No apparent deformity.  Trace edema bilaterally SKIN: no apparent skin lesion or wound NEURO: Awake, alert and oriented appropriately.  No apparent focal neuro deficit. PSYCH: Calm. Normal affect.  Discharge Instructions  Discharge Instructions    (HEART FAILURE PATIENTS) Call  MD:  Anytime you have any of the following symptoms: 1) 3 pound weight gain in 24 hours or 5 pounds in 1 week 2) shortness of breath, with or without a dry hacking cough 3) swelling in the hands, feet or stomach 4) if you have to sleep on extra pillows at night in order to breathe.   Complete by: As directed    Call MD for:  difficulty breathing, headache or visual disturbances   Complete by: As directed    Call MD for:  extreme fatigue   Complete by: As directed    Call MD for:  persistant dizziness or light-headedness   Complete by: As directed    Call MD for:  severe uncontrolled pain   Complete by: As directed    Diet - low sodium heart healthy   Complete by: As directed    Discharge instructions   Complete by: As directed    It has been a pleasure taking care of you!  You were hospitalized due to abdominal discomfort, shortness of breath and leg swelling most likely due to heart failure and anemia.  You have been treated with fluid medication and blood transfusion, and your symptoms improved to the point we think it is safe to let you go home and follow-up with your primary care  doctor and cardiologist.  We also recommend you follow-up with your gastroenterologist for further evaluation on your anemia.  The gynecologist's office may arrange outpatient follow-up for further evaluation.   There are some pulmonary nodules noted on the CT.  Those could also be due to fluid in the lungs.  We recommend you get a repeat CT for your lung to follow up on those.  Please review your new medication list and the directions on your medications before you take them.  In addition to taking your medications as prescribed, we also recommend you avoid alcohol or over-the-counter pain medication other than plain Tylenol, limit the amount of water/fluid you drink to less than 6 cups (1500 cc) a day,  limit your sodium (salt) intake to less than 2 g (2000 mg) a day and weigh yourself daily at the same time and  keeping your weight log.       Take care,   Increase activity slowly   Complete by: As directed      Allergies as of 02/17/2021      Reactions   Amoxicillin Swelling   Cause mouth swelling and itching   Azithromycin Nausea And Vomiting   Toprol Xl [metoprolol Succinate] Rash, Other (See Comments)   Reaction: hair loss      Medication List    STOP taking these medications   nitrofurantoin (macrocrystal-monohydrate) 100 MG capsule Commonly known as: MACROBID   predniSONE 5 MG (21) Tbpk tablet Commonly known as: STERAPRED UNI-PAK 21 TAB     TAKE these medications   albuterol 108 (90 Base) MCG/ACT inhaler Commonly known as: VENTOLIN HFA Inhale 2 puffs into the lungs every 6 (six) hours as needed for wheezing or shortness of breath.   amiodarone 200 MG tablet Commonly known as: PACERONE Take 100 mg by mouth daily.   aspirin EC 81 MG tablet Take 81 mg by mouth every morning.   cholecalciferol 1000 units tablet Commonly known as: VITAMIN D Take 2,000 Units by mouth every morning. What changed: Another medication with the same name was removed. Continue taking this medication, and follow the directions you see here.   fish oil-omega-3 fatty acids 1000 MG capsule Take 2 g by mouth every morning.   hydrALAZINE 50 MG tablet Commonly known as: APRESOLINE Take 50 mg by mouth 3 (three) times daily.   latanoprost 0.005 % ophthalmic solution Commonly known as: XALATAN Place 1 drop into both eyes at bedtime.   montelukast 10 MG tablet Commonly known as: SINGULAIR Take 1 tablet by mouth daily.   nebivolol 10 MG tablet Commonly known as: BYSTOLIC Take 10 mg by mouth every morning.   omeprazole 40 MG capsule Commonly known as: PRILOSEC Take 40 mg by mouth every morning.   ondansetron 4 MG tablet Commonly known as: ZOFRAN Take 4 mg by mouth every 8 (eight) hours as needed for nausea.   pravastatin 10 MG tablet Commonly known as: PRAVACHOL Take 10 mg by mouth at  bedtime.   telmisartan 40 MG tablet Commonly known as: MICARDIS Take 40 mg by mouth every morning.   torsemide 20 MG tablet Commonly known as: DEMADEX Take 1 tablet (20 mg total) by mouth daily. What changed:   medication strength  how much to take  when to take this  additional instructions       Consultations:  OB/GYN  Procedures/Studies:  Limited echo on 02/16/2021 1. Left ventricular ejection fraction, by estimation, is 60 to 65%. The  left ventricle has normal function. The  left ventricle has no regional  wall motion abnormalities. There is mild left ventricular hypertrophy.  2. Right ventricular systolic function is normal. The right ventricular  size is moderately enlarged.  3. Left atrial size was moderately dilated.  4. Right atrial size was mildly dilated.  5. The mitral valve is normal in structure. No evidence of mitral valve  regurgitation. No evidence of mitral stenosis.  6. Tricuspid valve regurgitation is moderate.  7. The aortic valve is normal in structure. There is moderate  calcification of the aortic valve. There is moderate thickening of the  aortic valve. Aortic valve regurgitation is not visualized. Mild aortic  valve stenosis. Aortic valve Vmax measures 2.49  m/s.  8. Pulmonic valve regurgitation is moderate.  9. The inferior vena cava is normal in size with greater than 50%  respiratory variability, suggesting right atrial pressure of 3 mmHg.    CT Chest Wo Contrast  Result Date: 02/15/2021 CLINICAL DATA:  Chest pain or shortness of breath. Evaluate for metastatic disease EXAM: CT CHEST WITHOUT CONTRAST TECHNIQUE: Multidetector CT imaging of the chest was performed following the standard protocol without IV contrast. COMPARISON:  10/15/2013 FINDINGS: Cardiovascular: Normal heart size. Dual-chamber pacer leads present. No pericardial effusion. Extensive atheromatous calcification of the aorta and coronaries. Mediastinum/Nodes:  Negative for adenopathy or mass. Lungs/Pleura: Multiple irregular pulmonary nodules which are seemingly random in distribution. Largest is in the lingula at 16 mm. No edema, effusion, or pneumothorax. Subpleural scarring in the right lower lobe associated with thoracic osteophytes. Upper Abdomen: Reported separately Musculoskeletal: No acute finding IMPRESSION: 1. Multiple pulmonary nodules with irregular appearance favoring infectious/inflammatory process over metastatic disease. Recommend chest CT follow-up in 2 months to differentiate. 2. Aortic and coronary atherosclerosis. Electronically Signed   By: Monte Fantasia M.D.   On: 02/15/2021 09:54   CT Renal Stone Study  Result Date: 02/15/2021 CLINICAL DATA:  85 year old female with history of left-sided flank pain and left lower quadrant abdominal pain. Suspected kidney stone. Potential diverticulitis. EXAM: CT ABDOMEN AND PELVIS WITHOUT CONTRAST TECHNIQUE: Multidetector CT imaging of the abdomen and pelvis was performed following the standard protocol without IV contrast. COMPARISON:  CT the abdomen and pelvis 06/06/2012. FINDINGS: Lower chest: Multiple pulmonary nodules noted in the visualize lung bases measuring up to 1.6 x 1.1 cm in the left upper lobe posteriorly (axial image 3 of series 4). Atherosclerotic calcifications in the thoracic aorta as well as the left anterior descending and left circumflex coronary arteries. Calcifications of the aortic valve. Pacemaker leads terminating in the right atrium and right ventricular apex. Hepatobiliary: Multiple tiny calcified granulomas in the lungs bilaterally. Liver has a slightly shrunken appearance and nodular contour, suggesting mild cirrhosis. Subcentimeter low-attenuation lesion in the right lobe of the liver (axial image 26 of series 2), incompletely characterized on today's non-contrast CT examination, but statistically likely to represent a cyst. No other larger more concerning liver lesions are  confidently identified on today's noncontrast CT examination. Status post cholecystectomy. Pancreas: No definite pancreatic mass or peripancreatic fluid collections or inflammatory changes are confidently identified on today's noncontrast CT examination. Spleen: Unremarkable. Adrenals/Urinary Tract: No calcifications are identified within the collecting system of either kidney, along the course of either ureter, or within the lumen of the urinary bladder. No hydroureteronephrosis. In the lower pole of the right kidney there is a 1.8 x 1.1 cm low-attenuation lesion which is incompletely characterized on today's non-contrast CT examination, but statistically likely to represent a tiny cyst. Multifocal cortical scarring in  the left kidney. Bilateral adrenal glands are normal in appearance. Unenhanced appearance of the urinary bladder is normal. Stomach/Bowel: Suture line in the proximal stomach. No pathologic dilatation of small bowel or colon. Normal appendix. Vascular/Lymphatic: Aortic atherosclerosis. No lymphadenopathy noted in the abdomen or pelvis. Reproductive: Status post hysterectomy. Ovaries are unremarkable in appearance. Other: No significant volume of ascites.  No pneumoperitoneum. Musculoskeletal: There are no aggressive appearing lytic or blastic lesions noted in the visualized portions of the skeleton. IMPRESSION: 1. No acute findings are noted in the abdomen or pelvis to account for the patient's symptoms. Specifically, no urinary tract calculi no findings of urinary tract obstruction. Additionally, there is no evidence of acute diverticulitis. 2. Multiple pulmonary nodules noted throughout the visualize lung bases measuring up to 1.6 x 1.1 cm in the inferior aspect of the left upper lobe. The possibility of metastatic disease to the lungs should be considered. Further evaluation with noncontrast chest CT is recommended at this time to establish a baseline for future follow-up examinations. 3. Aortic  atherosclerosis, in addition to two vessel coronary artery disease. 4. There are calcifications of the aortic valve. Echocardiographic correlation for evaluation of potential valvular dysfunction may be warranted if clinically indicated. 5. Additional incidental findings, as above. Electronically Signed   By: Vinnie Langton M.D.   On: 02/15/2021 08:32   ECHOCARDIOGRAM LIMITED  Result Date: 02/16/2021    ECHOCARDIOGRAM LIMITED REPORT   Patient Name:   [name] Date of Exam: 02/16/2021 Medical Rec #:  [id number]   Height:       65.0 in Accession #:    [id number]  Weight:       155.2 lb Date of Birth:  05/10/1936   BSA:          1.776 m Patient Age:    16 years    BP:           169/88 mmHg Patient Gender: F           HR:           90 bpm. Exam Location:  Inpatient Procedure: 2D Echo, Cardiac Doppler, Color Doppler and Limited Echo Indications:    [id number] Acute diastolic (congestive) heart failure  History:        Patient has no prior history of Echocardiogram examinations.                 Pacemaker. Patient came to the hospital with abdominal pain.                 Patient normally sees cardiology with [facility]:    [place] Referring Phys: [id number] [place]  1. Left ventricular ejection fraction, by estimation, is 60 to 65%. The left ventricle has normal function. The left ventricle has no regional wall motion abnormalities. There is mild left ventricular hypertrophy.  2. Right ventricular systolic function is normal. The right ventricular size is moderately enlarged.  3. Left atrial size was moderately dilated.  4. Right atrial size was mildly dilated.  5. The mitral valve is normal in structure. No evidence of mitral valve regurgitation. No evidence of mitral stenosis.  6. Tricuspid valve regurgitation is moderate.  7. The aortic valve is normal in structure. There is moderate calcification of the aortic valve. There is moderate thickening  of the aortic valve. Aortic valve regurgitation is not  visualized. Mild aortic valve stenosis. Aortic valve Vmax measures 2.49 m/s.  8. Pulmonic valve regurgitation is moderate.  9. The inferior vena cava is normal in size with greater than 50% respiratory variability, suggesting right atrial pressure of 3 mmHg. FINDINGS  Left Ventricle: Left ventricular ejection fraction, by estimation, is 60 to 65%. The left ventricle has normal function. The left ventricle has no regional wall motion abnormalities. The left ventricular internal cavity size was normal in size. There is  mild left ventricular hypertrophy. Right Ventricle: The right ventricular size is moderately enlarged. No increase in right ventricular wall thickness. Right ventricular systolic function is normal. Left Atrium: Left atrial size was moderately dilated. Right Atrium: Right atrial size was mildly dilated. Pericardium: There is no evidence of pericardial effusion. Mitral Valve: The mitral valve is normal in structure. No evidence of mitral valve stenosis. Tricuspid Valve: The tricuspid valve is normal in structure. Tricuspid valve regurgitation is moderate . No evidence of tricuspid stenosis. Aortic Valve: The aortic valve is normal in structure. There is moderate calcification of the aortic valve. There is moderate thickening of the aortic valve. Aortic valve regurgitation is not visualized. Mild aortic stenosis is present. Aortic valve mean  gradient measures 14.0 mmHg. Aortic valve peak gradient measures 24.8 mmHg. Aortic valve area, by VTI measures 0.73 cm. Pulmonic Valve: The pulmonic valve was normal in structure. Pulmonic valve regurgitation is moderate. No evidence of pulmonic stenosis. Aorta: The aortic root is normal in size and structure. Venous: The inferior vena cava is normal in size with greater than 50% respiratory variability, suggesting right atrial pressure of 3 mmHg. IAS/Shunts: No atrial level shunt detected by color flow  Doppler. Additional Comments: A device lead is visualized. LEFT VENTRICLE PLAX 2D LVIDd:         3.30 cm LVIDs:         2.20 cm LV PW:         1.30 cm LV IVS:        1.30 cm LVOT diam:     1.90 cm LV SV:         39 LV SV Index:   22 LVOT Area:     2.84 cm  RIGHT VENTRICLE          IVC RV Basal diam:  5.10 cm  IVC diam: 2.00 cm RV Mid diam:    3.90 cm LEFT ATRIUM            Index       RIGHT ATRIUM           Index LA diam:      3.60 cm  2.03 cm/m  RA Area:     16.60 cm LA Vol (A2C): 107.0 ml 60.25 ml/m RA Volume:   46.50 ml  26.18 ml/m LA Vol (A4C): 81.1 ml  45.66 ml/m  AORTIC VALVE AV Area (Vmax):    0.94 cm AV Area (Vmean):   0.93 cm AV Area (VTI):     0.73 cm AV Vmax:           249.00 cm/s AV Vmean:          180.000 cm/s AV VTI:            0.530 m AV Peak Grad:      24.8 mmHg AV Mean Grad:      14.0 mmHg LVOT Vmax:         82.80 cm/s LVOT Vmean:        59.100 cm/s  LVOT VTI:          0.137 m LVOT/AV VTI ratio: 0.26  AORTA Ao Root diam: 2.80 cm TRICUSPID VALVE TR Peak grad:   42.8 mmHg TR Vmax:        327.00 cm/s  SHUNTS Systemic VTI:  0.14 m Systemic Diam: 1.90 cm Candee Furbish MD Electronically signed by Candee Furbish MD Signature Date/Time: 02/16/2021/12:53:09 PM    Final         The results of significant diagnostics from this hospitalization (including imaging, microbiology, ancillary and laboratory) are listed below for reference.     Microbiology: Recent Results (from the past 240 hour(s))  Urine culture     Status: Abnormal   Collection Time: 02/15/21  7:38 AM   Specimen: Urine, Random  Result Value Ref Range Status   Specimen Description   Final    URINE, RANDOM Performed at [facility], [address] [place], [place] [address]    Special Requests   Final    NONE Performed at [facility], [address] [place], [place] [address]    Culture MULTIPLE SPECIES PRESENT, SUGGEST RECOLLECTION (A)  Final   Report Status 02/17/2021 FINAL  Final  Resp Panel by  RT-PCR (Flu A&B, Covid) Nasopharyngeal Swab     Status: None   Collection Time: 02/15/21  9:12 AM   Specimen: Nasopharyngeal Swab; Nasopharyngeal(NP) swabs in vial transport medium  Result Value Ref Range Status   SARS Coronavirus 2 by RT PCR NEGATIVE NEGATIVE Final    Comment: (NOTE) SARS-CoV-2 target nucleic acids are NOT DETECTED.  The SARS-CoV-2 RNA is generally detectable in upper respiratory specimens during the acute phase of infection. The lowest concentration of SARS-CoV-2 viral copies this assay can detect is 138 copies/mL. A negative result does not preclude SARS-Cov-2 infection and should not be used as the sole basis for treatment or other patient management decisions. A negative result may occur with  improper specimen collection/handling, submission of specimen other than nasopharyngeal swab, presence of viral mutation(s) within the areas targeted by this assay, and inadequate number of viral copies(<138 copies/mL). A negative result must be combined with clinical observations, patient history, and epidemiological information. The expected result is Negative.  Fact Sheet for Patients:  EntrepreneurPulse.com.au  Fact Sheet for Healthcare Providers:  IncredibleEmployment.be  This test is no t yet approved or cleared by the [place] FDA and  has been authorized for detection and/or diagnosis of SARS-CoV-2 by FDA under an Emergency Use Authorization (EUA). This EUA will remain  in effect (meaning this test can be used) for the duration of the COVID-19 declaration under Section 564(b)(1) of the Act, 21 U.S.C.section 360bbb-3(b)(1), unless the authorization is terminated  or revoked sooner.       Influenza A by PCR NEGATIVE NEGATIVE Final   Influenza B by PCR NEGATIVE NEGATIVE Final    Comment: (NOTE) The Xpert Xpress SARS-CoV-2/FLU/RSV plus assay is intended as an aid in the diagnosis of influenza from Nasopharyngeal swab  specimens and should not be used as a sole basis for treatment. Nasal washings and aspirates are unacceptable for Xpert Xpress SARS-CoV-2/FLU/RSV testing.  Fact Sheet for Patients: EntrepreneurPulse.com.au  Fact Sheet for Healthcare Providers: IncredibleEmployment.be  This test is not yet approved or cleared by the [place] FDA and has been authorized for detection and/or diagnosis of SARS-CoV-2 by FDA under an Emergency Use Authorization (EUA). This EUA will remain in effect (meaning this test can be used) for  the duration of the COVID-19 declaration under Section 564(b)(1) of the Act, 21 U.S.C. section 360bbb-3(b)(1), unless the authorization is terminated or revoked.  Performed at [facility], [address] [place], [place] [address]      Labs:  CBC: Recent Labs  Lab 02/15/21 0804 02/15/21 1308 02/16/21 0206 02/17/21 0326  WBC 7.2  --  8.9 9.6  NEUTROABS 5.8  --   --   --   HGB 7.4* 7.9* 8.3* 9.3*  HCT 23.1* 24.9* 25.9* 29.8*  MCV 93.9  --  91.2 91.7  PLT 155  --  153 161   BMP &GFR Recent Labs  Lab 02/15/21 0804 02/16/21 0206 02/17/21 0326  NA 143 140 138  K 3.4* 4.1 4.5  CL 110 106 98  CO2 '27 26 25  '$ GLUCOSE 100* 84 103*  BUN 34* 30* 32*  CREATININE 2.41* 2.41* 2.67*  CALCIUM 8.5* 8.5* 8.5*  MG  --   --  1.5*   Estimated Creatinine Clearance: 15.8 mL/min (A) (by C-G formula based on SCr of 2.67 mg/dL (H)). Liver & Pancreas: Recent Labs  Lab 02/15/21 0804  AST 16  ALT 10  ALKPHOS 63  BILITOT 0.5  PROT 6.3*  ALBUMIN 2.7*   Recent Labs  Lab 02/15/21 0804  LIPASE 24   No results for input(s): AMMONIA in the last 168 hours. Diabetic: No results for input(s): HGBA1C in the last 72 hours. No results for input(s): GLUCAP in the last 168 hours. Cardiac Enzymes: No results for input(s): CKTOTAL, CKMB, CKMBINDEX, TROPONINI in the last 168 hours. No results for input(s): PROBNP in the last  8760 hours. Coagulation Profile: Recent Labs  Lab 02/15/21 1432  INR 1.1   Thyroid Function Tests: No results for input(s): TSH, T4TOTAL, FREET4, T3FREE, THYROIDAB in the last 72 hours. Lipid Profile: No results for input(s): CHOL, HDL, LDLCALC, TRIG, CHOLHDL, LDLDIRECT in the last 72 hours. Anemia Panel: Recent Labs    02/15/21 0912 02/15/21 1308  VITAMINB12 216 236  FOLATE 9.0 10.5  FERRITIN 131 129  TIBC 210* 220*  IRON 22* 23*  RETICCTPCT 1.6 1.6   Urine analysis:    Component Value Date/Time   COLORURINE YELLOW 02/15/2021 0741   APPEARANCEUR CLEAR 02/15/2021 0741   LABSPEC <1.005 (L) 02/15/2021 0741   PHURINE 6.0 02/15/2021 0741   GLUCOSEU NEGATIVE 02/15/2021 0741   HGBUR NEGATIVE 02/15/2021 0741   BILIRUBINUR NEGATIVE 02/15/2021 0741   KETONESUR NEGATIVE 02/15/2021 0741   PROTEINUR 30 (A) 02/15/2021 0741   UROBILINOGEN 0.2 09/09/2013 1544   NITRITE NEGATIVE 02/15/2021 0741   LEUKOCYTESUR NEGATIVE 02/15/2021 0741   Sepsis Labs: Invalid input(s): PROCALCITONIN, LACTICIDVEN   Time coordinating discharge: 45 minutes  SIGNED:  Mercy Riding, MD  [facility] 02/17/2021, 5:13 PM  If 7PM-7AM, please contact night-coverage [email or web address]

## 2021-02-17 NOTE — Progress Notes (Signed)
[name] to be discharged Home per MD order. Discussed medications and follow up appointments with the patient.  Medication and prescription list explained in detail. Patient verbalized understanding.  Skin clean, dry and intact without evidence of skin break down, no evidence of skin tears noted. IV catheter discontinued intact. Site without signs and symptoms of complications. Dressing and pressure applied. Pt denies pain at the site currently. No complaints noted.  Patient free of lines, drains, and wounds.   An After Visit Summary (AVS) was printed and given to the patient. Patient escorted via wheelchair, and discharged home via private auto.  Amaryllis Dyke, RN

## 2022-04-18 ENCOUNTER — Other Ambulatory Visit: Payer: Self-pay

## 2022-04-18 ENCOUNTER — Encounter (HOSPITAL_BASED_OUTPATIENT_CLINIC_OR_DEPARTMENT_OTHER): Payer: Self-pay | Admitting: Emergency Medicine

## 2022-04-18 ENCOUNTER — Emergency Department (HOSPITAL_BASED_OUTPATIENT_CLINIC_OR_DEPARTMENT_OTHER)
Admission: EM | Admit: 2022-04-18 | Discharge: 2022-04-18 | Disposition: A | Discharge status: Home / Self Care | Payer: Medicare Other | Attending: Emergency Medicine | Admitting: Emergency Medicine

## 2022-04-18 DIAGNOSIS — M545 Low back pain, unspecified: Secondary | ICD-10-CM | POA: Diagnosis present

## 2022-04-18 DIAGNOSIS — Z7982 Long term (current) use of aspirin: Secondary | ICD-10-CM | POA: Insufficient documentation

## 2022-04-18 MED ORDER — HYDROMORPHONE HCL 1 MG/ML IJ SOLN: 1.0000 mg | Freq: Once | INTRAMUSCULAR | Status: DC

## 2022-04-18 MED ORDER — HYDROMORPHONE HCL 1 MG/ML IJ SOLN
0.5000 mg | Freq: Once | INTRAMUSCULAR | Status: AC
  Administered 2022-04-18: 0.5 mg via INTRAMUSCULAR
  Filled 2022-04-18: qty 1

## 2022-04-18 MED ORDER — HYDROMORPHONE HCL 2 MG PO TABS: 2.0000 mg | ORAL_TABLET | ORAL | 0 refills | Status: AC | PRN

## 2022-04-18 MED ORDER — HYDROMORPHONE HCL 1 MG/ML IJ SOLN
1.0000 mg | Freq: Once | INTRAMUSCULAR | Status: AC
  Administered 2022-04-18: 1 mg via INTRAMUSCULAR
  Filled 2022-04-18: qty 1

## 2022-04-18 NOTE — ED Notes (Signed)
Dc instructions and scripts reviewed with pt. Pt in a lot of pain when getting into the wheelchair. MD made aware and sent prescription to pharmacy. Pt requested to be wheeled outside to wait for niece to arrive to transport home. No other questions or concerns at this time.

## 2022-04-18 NOTE — Discharge Instructions (Signed)
Follow-up with your primary doctor and your spine doctor regarding your back pain.  Come back if you develop numbness, weakness, bladder or bowel incontinence, urinary retention or other new concerning symptoms.

## 2022-04-18 NOTE — ED Triage Notes (Signed)
Pt bib [facility], c/o lower back pain, mid and right side radiating down right leg. Hx of same. Deneis dysuria

## 2022-04-18 NOTE — ED Provider Notes (Signed)
[facility] Provider Note   CSN: [id number] Arrival date & time: 04/18/22  0935     History  Chief Complaint  Patient presents with   Back Pain    [name] is a 86 y.o. female.  Presenting to the emergency room due to concern for back pain.  Has suffered from back pain for many years.  Has been seen by multiple different specialists.  States that her spine doctor says she does not need spine surgery.  Pain today similar to prior.  States that her at home hydrocodone was not doing enough for her pain.  Pain radiates down right leg.  No numbness, weakness, bladder or bowel incontinence, urinary retention, fevers, trauma.  HPI     Home Medications Prior to Admission medications   Medication Sig Start Date End Date Taking? Authorizing Provider  albuterol (VENTOLIN HFA) 108 (90 Base) MCG/ACT inhaler Inhale 2 puffs into the lungs every 6 (six) hours as needed for wheezing or shortness of breath. 06/22/17   [provider]  amiodarone (PACERONE) 200 MG tablet Take 100 mg by mouth daily.    [provider]  aspirin EC 81 MG tablet Take 81 mg by mouth every morning.    [provider]  cholecalciferol (VITAMIN D) 1000 UNITS tablet Take 2,000 Units by mouth every morning.    [provider]  fish oil-omega-3 fatty acids 1000 MG capsule Take 2 g by mouth every morning.    [provider]  hydrALAZINE (APRESOLINE) 50 MG tablet Take 50 mg by mouth 3 (three) times daily.    [provider]  latanoprost (XALATAN) 0.005 % ophthalmic solution Place 1 drop into both eyes at bedtime.    [provider]  montelukast (SINGULAIR) 10 MG tablet Take 1 tablet by mouth daily. 01/05/21   [provider]  nebivolol (BYSTOLIC) 10 MG tablet Take 10 mg by mouth every morning.    [provider]  omeprazole (PRILOSEC) 40 MG capsule Take 40 mg by mouth every morning.    [provider]   ondansetron (ZOFRAN) 4 MG tablet Take 4 mg by mouth every 8 (eight) hours as needed for nausea. 11/21/20   [provider]  pravastatin (PRAVACHOL) 10 MG tablet Take 10 mg by mouth at bedtime.    [provider]  telmisartan (MICARDIS) 40 MG tablet Take 40 mg by mouth every morning.    [provider]  torsemide (DEMADEX) 20 MG tablet Take 1 tablet (20 mg total) by mouth daily. 02/17/21   Mercy Riding, MD      Allergies    Amoxicillin, Azithromycin, and Toprol xl [metoprolol succinate]    Review of Systems   Review of Systems  Musculoskeletal:  Positive for back pain.  All other systems reviewed and are negative.   Physical Exam Updated Vital Signs BP (!) 159/66   Pulse 82   Temp 97.6 F (36.4 C) (Oral)   Resp 16   Ht 5\' 5"  (1.651 m)   Wt 54 kg   SpO2 98%   BMI 19.80 kg/m  Physical Exam Vitals and nursing note reviewed.  Constitutional:      General: She is not in acute distress.    Appearance: She is well-developed.  HENT:     Head: Normocephalic and atraumatic.  Eyes:     Conjunctiva/sclera: Conjunctivae normal.  Cardiovascular:     Rate and Rhythm: Normal rate.     Pulses: Normal pulses.  Pulmonary:  Effort: Pulmonary effort is normal. No respiratory distress.  Abdominal:     Palpations: Abdomen is soft.     Tenderness: There is no abdominal tenderness.  Musculoskeletal:        General: No swelling.     Cervical back: Neck supple.     Comments: Some tenderness across the lower lumbar region, no deformity noted  Skin:    General: Skin is warm and dry.     Capillary Refill: Capillary refill takes less than 2 seconds.  Neurological:     Mental Status: She is alert.     Comments: Normal strength and sensation in her lower extremities  Psychiatric:        Mood and Affect: Mood normal.     ED Results / Procedures / Treatments   Labs (all labs ordered are listed, but only abnormal results are displayed) Labs Reviewed - No data to  display  EKG None  Radiology No results found.  Procedures Procedures    Medications Ordered in ED Medications  HYDROmorphone (DILAUDID) injection 1 mg (1 mg Intramuscular Given 04/18/22 1017)  HYDROmorphone (DILAUDID) injection 0.5 mg (0.5 mg Intramuscular Given 04/18/22 1151)    ED Course/ Medical Decision Making/ A&P                           Medical Decision Making Risk Prescription drug management.   86 year old lady presenting to the emergency room due to concern for low back pain.  Long history of low back pain.  No red flag symptoms today.  Well-appearing.  Pain well controlled in the ER after getting single dose of Dilaudid.  Advise following up with her primary care doctor as well as spine doctor.  Reviewed return precautions in detail and discharged home with family.        Final Clinical Impression(s) / ED Diagnoses Final diagnoses:  Low back pain, unspecified back pain laterality, unspecified chronicity, unspecified whether sciatica present    Rx / DC Orders ED Discharge Orders     None         Lucrezia Starch, MD 04/18/22 1203

## 2022-12-13 ENCOUNTER — Other Ambulatory Visit (HOSPITAL_BASED_OUTPATIENT_CLINIC_OR_DEPARTMENT_OTHER): Payer: Self-pay

## 2023-03-21 DEATH — deceased
